# Patient Record
Sex: Male | Born: 1995 | Race: Black or African American | Hispanic: No | Marital: Single | Smoking: Never smoker
Health system: Southern US, Community
[De-identification: ages and names within clinical notes are randomized; demographics above are authoritative.]

## PROBLEM LIST (undated history)

## (undated) DIAGNOSIS — R519 Headache, unspecified: Secondary | ICD-10-CM

## (undated) DIAGNOSIS — R51 Headache: Secondary | ICD-10-CM

## (undated) HISTORY — PX: OTHER SURGICAL HISTORY: SHX169

---

## 2016-03-21 ENCOUNTER — Encounter (HOSPITAL_COMMUNITY): Payer: Self-pay | Admitting: Emergency Medicine

## 2016-03-21 ENCOUNTER — Emergency Department (HOSPITAL_COMMUNITY)
Admission: EM | Admit: 2016-03-21 | Discharge: 2016-03-21 | Disposition: A | Discharge status: Home / Self Care | Payer: Worker's Compensation | Attending: Dermatology | Admitting: Dermatology

## 2016-03-21 DIAGNOSIS — W228XXA Striking against or struck by other objects, initial encounter: Secondary | ICD-10-CM | POA: Insufficient documentation

## 2016-03-21 DIAGNOSIS — Z5321 Procedure and treatment not carried out due to patient leaving prior to being seen by health care provider: Secondary | ICD-10-CM | POA: Diagnosis not present

## 2016-03-21 DIAGNOSIS — S0990XA Unspecified injury of head, initial encounter: Secondary | ICD-10-CM | POA: Insufficient documentation

## 2016-03-21 DIAGNOSIS — Y939 Activity, unspecified: Secondary | ICD-10-CM | POA: Diagnosis not present

## 2016-03-21 DIAGNOSIS — Y990 Civilian activity done for income or pay: Secondary | ICD-10-CM | POA: Diagnosis not present

## 2016-03-21 DIAGNOSIS — Y929 Unspecified place or not applicable: Secondary | ICD-10-CM | POA: Diagnosis not present

## 2016-03-21 NOTE — ED Notes (Signed)
Transport team called pt multiple times unable to locate in [place]. This RN walked out to waiting area unable to find pt.

## 2016-03-21 NOTE — ED Notes (Signed)
Pt reports the was a work and a pallet that was 1 ft above him fell and hit him on the left side of his head. Pt denies any LOC. Pt states it did not knock him over. Pt states afterwards he has blurry vision on the right side that is improving but still "a little blurry" Pt was sent here from health works for possible CT scan per pt. Pt is alert and ox4. PERRLA. Denies any dizziness.

## 2016-03-21 NOTE — ED Notes (Signed)
Pt still unable to be located in waiting room. Pt did not inform any medical staff that he was leaving.

## 2016-03-22 ENCOUNTER — Encounter (HOSPITAL_COMMUNITY): Payer: Self-pay | Admitting: Emergency Medicine

## 2016-03-22 ENCOUNTER — Emergency Department (HOSPITAL_COMMUNITY)
Admission: EM | Admit: 2016-03-22 | Discharge: 2016-03-22 | Disposition: A | Discharge status: Home / Self Care | Payer: Worker's Compensation | Attending: Emergency Medicine | Admitting: Emergency Medicine

## 2016-03-22 DIAGNOSIS — Y9289 Other specified places as the place of occurrence of the external cause: Secondary | ICD-10-CM | POA: Diagnosis not present

## 2016-03-22 DIAGNOSIS — S0990XA Unspecified injury of head, initial encounter: Secondary | ICD-10-CM | POA: Diagnosis present

## 2016-03-22 DIAGNOSIS — Y990 Civilian activity done for income or pay: Secondary | ICD-10-CM | POA: Insufficient documentation

## 2016-03-22 DIAGNOSIS — Y9389 Activity, other specified: Secondary | ICD-10-CM | POA: Insufficient documentation

## 2016-03-22 DIAGNOSIS — W208XXA Other cause of strike by thrown, projected or falling object, initial encounter: Secondary | ICD-10-CM | POA: Diagnosis not present

## 2016-03-22 DIAGNOSIS — S0093XA Contusion of unspecified part of head, initial encounter: Secondary | ICD-10-CM | POA: Diagnosis not present

## 2016-03-22 NOTE — ED Provider Notes (Signed)
CSN: [id number]     Arrival date & time 03/22/16  1703 History  By signing my name below, I, Dalton Hobbs, attest that this documentation has been prepared under the direction and in the presence of Kerrie Buffalo, NP Electronically Signed: Soijett Hobbs, ED Scribe. 03/22/2016. 7:56 PM.   Chief Complaint  Patient presents with  . Head Injury      The history is provided by the patient. No language interpreter was used.    Dalton Hobbs is a 20 y.o. male who presents to the Emergency Department complaining of left sided head injury occurring 9:30 AM yesterday. Pt states that a plastic bin that was approxiamtely 30 lbs fell 1.5 feet and struck the left side of his head and face. Pt reports that he was sent home from his shift and taken to a health center. Pt notes that he was informed to come into the ED for a CT head due to the pt blurred vision. Pt reports that he came into the ED last night but "had to wait to long" then his brother called and needed him to do something so he left. He returns tonightt due to intermittent pain to his left sided head and face. Patient reports resolved blurred vision, resolved dizziness, and resolved HA. He notes that he has not tried any medications for the relief of his symptoms. He denies color change, wound, rash, LOC, neck pain, ear pain, bowel/bladder incontinence, and any other symptoms. He needs a work note to be able to return to work.    History reviewed. No pertinent past medical history. History reviewed. No pertinent past surgical history. History reviewed. No pertinent family history. Social History  Substance Use Topics  . Smoking status: Never Smoker   . Smokeless tobacco: None  . Alcohol Use: No    Review of Systems  HENT: Negative for ear pain.   Eyes: Positive for visual disturbance (resolved blurred vision).  Gastrointestinal:       No bowel incontinence.  Genitourinary:       No bladder incontinence.  Musculoskeletal: Negative for neck  pain.  Skin: Negative for color change, rash and wound.  Neurological: Positive for dizziness (resolved) and headaches (resolved). Negative for syncope.   All other systems negative   Allergies  Review of patient's allergies indicates no known allergies.  Home Medications   Prior to Admission medications   Not on File   BP 119/69 mmHg  Pulse 55  Temp(Src) 97.9 F (36.6 C) (Oral)  Resp 16  SpO2 100% Physical Exam  Constitutional: He is oriented to person, place, and time. He appears well-developed and well-nourished. No distress.  HENT:  Head: Normocephalic. Head is without abrasion and without laceration.  Right Ear: Tympanic membrane normal.  Left Ear: Tympanic membrane normal.  Nose: Nose normal.  Mouth/Throat: Uvula is midline, oropharynx is clear and moist and mucous membranes are normal. Normal dentition.  No ecchymosis or visible injury to the face or scalp.  Eyes: Conjunctivae and EOM are normal. Pupils are equal, round, and reactive to light. Right eye exhibits no discharge. Left eye exhibits no discharge. No scleral icterus.  Nl visual fields.   Neck: Normal range of motion. Neck supple.  Full ROM of neck without pain  Cardiovascular: Normal rate and regular rhythm.   Radial pulse 2+  Pulmonary/Chest: Effort normal and breath sounds normal. No respiratory distress.  Abdominal: He exhibits no distension.  Musculoskeletal: Normal range of motion. He exhibits no edema.  Grip strength equal  bilaterally.   Neurological: He is alert and oriented to person, place, and time. He has normal strength. No cranial nerve deficit or sensory deficit. He displays a negative Romberg sign. Gait normal.  Reflex Scores:      Bicep reflexes are 2+ on the right side and 2+ on the left side.      Brachioradialis reflexes are 2+ on the right side and 2+ on the left side.      Patellar reflexes are 2+ on the right side and 2+ on the left side. Rapid alternating movement without  difficulty. Stands on one foot without difficulty.  Skin: Skin is warm and dry.  Psychiatric: He has a normal mood and affect. His behavior is normal.  Nursing note and vitals reviewed.   ED Course  Procedures (including critical care time) DIAGNOSTIC STUDIES: Oxygen Saturation is 99% on RA, nl by my interpretation.    COORDINATION OF CARE: 7:55 PM Discussed treatment plan with pt at bedside and pt agreed to plan.    MDM  20 y.o. male here for evaluation of injury that occurred over 24 hours ago stable for d/c without any focal neuro deficits, no headache, no n/v, no neck pain. CT of head not indicated at this time. Discussed with the patient plan of care and all questioned fully answered. He will return if any problems arise.   Final diagnoses:  Contusion of head, initial encounter    I personally performed the services described in this documentation, which was scribed in my presence. The recorded information has been reviewed and is accurate.    [address]Teron Blais ChesilhurstM Tila Millirons, [place] 03/23/16 1607  Linwood DibblesJon Knapp, MD 03/24/16 [phone]

## 2016-03-22 NOTE — ED Notes (Signed)
Pt sts hit on left side of head and face by plastic bin at work yesterday; pt denies LOC; pt sts having pain

## 2016-03-22 NOTE — Discharge Instructions (Signed)

## 2016-03-22 NOTE — ED Notes (Signed)
Discharge instructions given - voiced understanding,  Return to work note given to patient

## 2016-03-22 NOTE — ED Notes (Signed)
Patient presents stating he was hit in the head yesterday by a box that fell about 1 1/602feet and hit him on the left side of the temple area.  Came here yesterday for a CT but stated he had an emergency and had to leave.  Denies any nausea, vision changes, etc today.  Stated he tried to go back to work today but they told him he could not work until he received treatment

## 2016-06-09 ENCOUNTER — Observation Stay (HOSPITAL_COMMUNITY)
Admission: EM | Admit: 2016-06-09 | Discharge: 2016-06-11 | Disposition: A | Discharge status: Home / Self Care | Payer: BLUE CROSS/BLUE SHIELD | Attending: Surgery | Admitting: Surgery

## 2016-06-09 ENCOUNTER — Observation Stay (HOSPITAL_COMMUNITY): Payer: BLUE CROSS/BLUE SHIELD | Admitting: Anesthesiology

## 2016-06-09 ENCOUNTER — Encounter (HOSPITAL_COMMUNITY): Payer: Self-pay | Admitting: Emergency Medicine

## 2016-06-09 ENCOUNTER — Emergency Department (HOSPITAL_COMMUNITY): Payer: BLUE CROSS/BLUE SHIELD

## 2016-06-09 ENCOUNTER — Encounter (HOSPITAL_COMMUNITY)
Admission: EM | Disposition: A | Discharge status: Home / Self Care | Payer: Self-pay | Source: Home / Self Care | Attending: Emergency Medicine

## 2016-06-09 DIAGNOSIS — K3580 Unspecified acute appendicitis: Secondary | ICD-10-CM | POA: Diagnosis present

## 2016-06-09 DIAGNOSIS — K353 Acute appendicitis with localized peritonitis, without perforation or gangrene: Secondary | ICD-10-CM

## 2016-06-09 DIAGNOSIS — K37 Unspecified appendicitis: Secondary | ICD-10-CM | POA: Diagnosis present

## 2016-06-09 DIAGNOSIS — Z6833 Body mass index (BMI) 33.0-33.9, adult: Secondary | ICD-10-CM | POA: Diagnosis not present

## 2016-06-09 DIAGNOSIS — E669 Obesity, unspecified: Secondary | ICD-10-CM | POA: Diagnosis not present

## 2016-06-09 HISTORY — DX: Headache: R51

## 2016-06-09 HISTORY — PX: APPENDECTOMY: SHX54

## 2016-06-09 HISTORY — PX: LAPAROSCOPIC APPENDECTOMY: SHX408

## 2016-06-09 HISTORY — DX: Headache, unspecified: R51.9

## 2016-06-09 LAB — COMPREHENSIVE METABOLIC PANEL
ALBUMIN: 3.7 g/dL (ref 3.5–5.0)
ALT: 14 U/L — ABNORMAL LOW (ref 17–63)
ANION GAP: 11 (ref 5–15)
AST: 13 U/L — ABNORMAL LOW (ref 15–41)
Alkaline Phosphatase: 44 U/L (ref 38–126)
BUN: 15 mg/dL (ref 6–20)
CALCIUM: 9.1 mg/dL (ref 8.9–10.3)
CHLORIDE: 103 mmol/L (ref 101–111)
CO2: 24 mmol/L (ref 22–32)
Creatinine, Ser: 1.02 mg/dL (ref 0.61–1.24)
GFR calc non Af Amer: >60 mL/min (ref >60)
GLUCOSE: 106 mg/dL — AB (ref 65–99)
POTASSIUM: 3.5 mmol/L (ref 3.5–5.1)
SODIUM: 138 mmol/L (ref 135–145)
Total Bilirubin: 0.8 mg/dL (ref 0.3–1.2)
Total Protein: 6.4 g/dL — ABNORMAL LOW (ref 6.5–8.1)

## 2016-06-09 LAB — URINALYSIS, ROUTINE W REFLEX MICROSCOPIC
BILIRUBIN URINE: NEGATIVE
Glucose, UA: NEGATIVE mg/dL
Ketones, ur: NEGATIVE mg/dL
Leukocytes, UA: NEGATIVE
Nitrite: NEGATIVE
Protein, ur: NEGATIVE mg/dL
SPECIFIC GRAVITY, URINE: 1.037 — AB (ref 1.005–1.030)
pH: 6 (ref 5.0–8.0)

## 2016-06-09 LAB — URINE MICROSCOPIC-ADD ON: Bacteria, UA: NONE SEEN

## 2016-06-09 LAB — CBC
HCT: 42.1 % (ref 39.0–52.0)
HEMATOCRIT: 43.6 % (ref 39.0–52.0)
HEMOGLOBIN: 14.9 g/dL (ref 13.0–17.0)
Hemoglobin: 13.9 g/dL (ref 13.0–17.0)
MCH: 28.8 pg (ref 26.0–34.0)
MCH: 28.2 pg (ref 26.0–34.0)
MCHC: 34.2 g/dL (ref 30.0–36.0)
MCHC: 33 g/dL (ref 30.0–36.0)
MCV: 84.3 fL (ref 78.0–100.0)
MCV: 85.4 fL (ref 78.0–100.0)
PLATELETS: 199 10*3/uL (ref 150–400)
Platelets: 225 10*3/uL (ref 150–400)
RBC: 5.17 MIL/uL (ref 4.22–5.81)
RBC: 4.93 MIL/uL (ref 4.22–5.81)
RDW: 12.9 % (ref 11.5–15.5)
RDW: 13 % (ref 11.5–15.5)
WBC: 10.7 10*3/uL — ABNORMAL HIGH (ref 4.0–10.5)
WBC: 13.3 10*3/uL — AB (ref 4.0–10.5)

## 2016-06-09 LAB — CREATININE, SERUM
CREATININE: 0.85 mg/dL (ref 0.61–1.24)
GFR calc non Af Amer: >60 mL/min (ref >60)

## 2016-06-09 LAB — LIPASE, BLOOD: LIPASE: 24 U/L (ref 11–51)

## 2016-06-09 SURGERY — APPENDECTOMY, LAPAROSCOPIC
Anesthesia: General | Site: Abdomen

## 2016-06-09 MED ORDER — OXYCODONE HCL 5 MG PO TABS: 5.0000 mg | ORAL_TABLET | ORAL | Status: DC | PRN

## 2016-06-09 MED ORDER — SUGAMMADEX SODIUM 200 MG/2ML IV SOLN
INTRAVENOUS | Status: DC | PRN
  Administered 2016-06-09: 200 mg via INTRAVENOUS

## 2016-06-09 MED ORDER — FENTANYL CITRATE (PF) 100 MCG/2ML IJ SOLN
INTRAMUSCULAR | Status: AC
  Filled 2016-06-09: qty 4

## 2016-06-09 MED ORDER — 0.9 % SODIUM CHLORIDE (POUR BTL) OPTIME
TOPICAL | Status: DC | PRN
  Administered 2016-06-09: 1000 mL

## 2016-06-09 MED ORDER — ACETAMINOPHEN 325 MG PO TABS
650.0000 mg | ORAL_TABLET | Freq: Four times a day (QID) | ORAL | Status: DC | PRN
  Administered 2016-06-10: 650 mg via ORAL
  Filled 2016-06-09: qty 2

## 2016-06-09 MED ORDER — PROPOFOL 10 MG/ML IV BOLUS
INTRAVENOUS | Status: AC
  Filled 2016-06-09: qty 40

## 2016-06-09 MED ORDER — LACTATED RINGERS IV SOLN
INTRAVENOUS | Status: DC
  Administered 2016-06-09: 11:00:00 via INTRAVENOUS

## 2016-06-09 MED ORDER — ENOXAPARIN SODIUM 40 MG/0.4ML ~~LOC~~ SOLN
40.0000 mg | SUBCUTANEOUS | Status: DC
  Administered 2016-06-10 – 2016-06-11 (×2): 40 mg via SUBCUTANEOUS
  Filled 2016-06-09 (×2): qty 0.4

## 2016-06-09 MED ORDER — OXYCODONE HCL 5 MG PO TABS
5.0000 mg | ORAL_TABLET | ORAL | Status: DC | PRN
  Administered 2016-06-09 – 2016-06-10 (×4): 10 mg via ORAL
  Filled 2016-06-09 (×4): qty 2

## 2016-06-09 MED ORDER — KETOROLAC TROMETHAMINE 30 MG/ML IJ SOLN
INTRAMUSCULAR | Status: DC | PRN
  Administered 2016-06-09: 30 mg via INTRAVENOUS

## 2016-06-09 MED ORDER — ONDANSETRON HCL 4 MG/2ML IJ SOLN: 4.0000 mg | Freq: Four times a day (QID) | INTRAMUSCULAR | Status: DC | PRN

## 2016-06-09 MED ORDER — ENOXAPARIN SODIUM 40 MG/0.4ML ~~LOC~~ SOLN: 40.0000 mg | SUBCUTANEOUS | Status: DC

## 2016-06-09 MED ORDER — ROCURONIUM BROMIDE 10 MG/ML (PF) SYRINGE
PREFILLED_SYRINGE | INTRAVENOUS | Status: AC
  Filled 2016-06-09: qty 10

## 2016-06-09 MED ORDER — LIDOCAINE 2% (20 MG/ML) 5 ML SYRINGE
INTRAMUSCULAR | Status: AC
  Filled 2016-06-09: qty 5

## 2016-06-09 MED ORDER — ONDANSETRON HCL 4 MG/2ML IJ SOLN
INTRAMUSCULAR | Status: AC
  Filled 2016-06-09: qty 2

## 2016-06-09 MED ORDER — SUCCINYLCHOLINE CHLORIDE 200 MG/10ML IV SOSY
PREFILLED_SYRINGE | INTRAVENOUS | Status: DC | PRN
  Administered 2016-06-09: 100 mg via INTRAVENOUS

## 2016-06-09 MED ORDER — HYDROMORPHONE HCL 1 MG/ML IJ SOLN
1.0000 mg | Freq: Once | INTRAMUSCULAR | Status: AC
  Administered 2016-06-09: 1 mg via INTRAVENOUS
  Filled 2016-06-09: qty 1

## 2016-06-09 MED ORDER — HYDROMORPHONE HCL 1 MG/ML IJ SOLN: 1.0000 mg | INTRAMUSCULAR | Status: DC | PRN

## 2016-06-09 MED ORDER — ROCURONIUM BROMIDE 10 MG/ML (PF) SYRINGE
PREFILLED_SYRINGE | INTRAVENOUS | Status: DC | PRN
  Administered 2016-06-09: 30 mg via INTRAVENOUS

## 2016-06-09 MED ORDER — SODIUM CHLORIDE 0.9 % IR SOLN
Status: DC | PRN
  Administered 2016-06-09: 1000 mL

## 2016-06-09 MED ORDER — FENTANYL CITRATE (PF) 100 MCG/2ML IJ SOLN
INTRAMUSCULAR | Status: DC | PRN
  Administered 2016-06-09 (×3): 50 ug via INTRAVENOUS
  Administered 2016-06-09: 100 ug via INTRAVENOUS

## 2016-06-09 MED ORDER — SODIUM CHLORIDE 0.45 % IV SOLN
INTRAVENOUS | Status: DC
  Administered 2016-06-09: 09:00:00 via INTRAVENOUS

## 2016-06-09 MED ORDER — ACETAMINOPHEN 325 MG PO TABS: 650.0000 mg | ORAL_TABLET | Freq: Four times a day (QID) | ORAL | Status: DC | PRN

## 2016-06-09 MED ORDER — ACETAMINOPHEN 650 MG RE SUPP: 650.0000 mg | Freq: Four times a day (QID) | RECTAL | Status: DC | PRN

## 2016-06-09 MED ORDER — SODIUM CHLORIDE 0.9 % IV BOLUS (SEPSIS)
500.0000 mL | Freq: Once | INTRAVENOUS | Status: AC
  Administered 2016-06-09: 500 mL via INTRAVENOUS

## 2016-06-09 MED ORDER — SUGAMMADEX SODIUM 200 MG/2ML IV SOLN
INTRAVENOUS | Status: AC
  Filled 2016-06-09: qty 2

## 2016-06-09 MED ORDER — KETOROLAC TROMETHAMINE 30 MG/ML IJ SOLN
INTRAMUSCULAR | Status: AC
  Filled 2016-06-09: qty 1

## 2016-06-09 MED ORDER — BUPIVACAINE HCL (PF) 0.25 % IJ SOLN
INTRAMUSCULAR | Status: DC | PRN
  Administered 2016-06-09: 20 mL

## 2016-06-09 MED ORDER — LIDOCAINE HCL (CARDIAC) 20 MG/ML IV SOLN
INTRAVENOUS | Status: DC | PRN
  Administered 2016-06-09: 60 mg via INTRAVENOUS

## 2016-06-09 MED ORDER — FENTANYL CITRATE (PF) 100 MCG/2ML IJ SOLN
INTRAMUSCULAR | Status: AC
  Filled 2016-06-09: qty 2

## 2016-06-09 MED ORDER — DIPHENHYDRAMINE HCL 25 MG PO CAPS: 25.0000 mg | ORAL_CAPSULE | Freq: Four times a day (QID) | ORAL | Status: DC | PRN

## 2016-06-09 MED ORDER — DIPHENHYDRAMINE HCL 50 MG/ML IJ SOLN: 25.0000 mg | Freq: Four times a day (QID) | INTRAMUSCULAR | Status: DC | PRN

## 2016-06-09 MED ORDER — BUPIVACAINE HCL (PF) 0.25 % IJ SOLN
INTRAMUSCULAR | Status: AC
  Filled 2016-06-09: qty 30

## 2016-06-09 MED ORDER — DEXTROSE 5 % IV SOLN
2.0000 g | INTRAVENOUS | Status: DC
  Administered 2016-06-09: 2 g via INTRAVENOUS
  Filled 2016-06-09: qty 2

## 2016-06-09 MED ORDER — METRONIDAZOLE IN NACL 5-0.79 MG/ML-% IV SOLN
500.0000 mg | Freq: Three times a day (TID) | INTRAVENOUS | Status: DC
  Administered 2016-06-09: 500 mg via INTRAVENOUS
  Filled 2016-06-09: qty 100

## 2016-06-09 MED ORDER — MORPHINE SULFATE (PF) 2 MG/ML IV SOLN
1.0000 mg | INTRAVENOUS | Status: DC | PRN
  Administered 2016-06-09: 2 mg via INTRAVENOUS
  Filled 2016-06-09: qty 1

## 2016-06-09 MED ORDER — POTASSIUM CHLORIDE IN NACL 20-0.9 MEQ/L-% IV SOLN
INTRAVENOUS | Status: DC
  Administered 2016-06-09: 15:00:00 via INTRAVENOUS
  Filled 2016-06-09 (×4): qty 1000

## 2016-06-09 MED ORDER — ONDANSETRON HCL 4 MG/2ML IJ SOLN
4.0000 mg | Freq: Once | INTRAMUSCULAR | Status: AC
  Administered 2016-06-09: 4 mg via INTRAVENOUS
  Filled 2016-06-09: qty 2

## 2016-06-09 MED ORDER — IOPAMIDOL (ISOVUE-300) INJECTION 61%
100.0000 mL | Freq: Once | INTRAVENOUS | Status: AC | PRN
  Administered 2016-06-09: 100 mL via INTRAVENOUS

## 2016-06-09 MED ORDER — ONDANSETRON HCL 4 MG/2ML IJ SOLN
INTRAMUSCULAR | Status: DC | PRN
  Administered 2016-06-09: 4 mg via INTRAVENOUS

## 2016-06-09 MED ORDER — POLYETHYLENE GLYCOL 3350 17 G PO PACK: 17.0000 g | PACK | Freq: Every day | ORAL | Status: DC | PRN

## 2016-06-09 MED ORDER — PROPOFOL 10 MG/ML IV BOLUS
INTRAVENOUS | Status: DC | PRN
  Administered 2016-06-09: 50 mg via INTRAVENOUS
  Administered 2016-06-09: 40 mg via INTRAVENOUS
  Administered 2016-06-09: 160 mg via INTRAVENOUS

## 2016-06-09 MED ORDER — ONDANSETRON 4 MG PO TBDP
4.0000 mg | ORAL_TABLET | Freq: Four times a day (QID) | ORAL | Status: DC | PRN
  Administered 2016-06-10: 4 mg via ORAL
  Filled 2016-06-09: qty 1

## 2016-06-09 MED ORDER — BISACODYL 5 MG PO TBEC: 5.0000 mg | DELAYED_RELEASE_TABLET | Freq: Every day | ORAL | Status: DC | PRN

## 2016-06-09 MED ORDER — IOPAMIDOL (ISOVUE-300) INJECTION 61%
INTRAVENOUS | Status: AC
  Filled 2016-06-09: qty 100

## 2016-06-09 MED ORDER — ROCURONIUM BROMIDE 100 MG/10ML IV SOLN: INTRAVENOUS | Status: DC | PRN

## 2016-06-09 MED ORDER — SUCCINYLCHOLINE CHLORIDE 20 MG/ML IJ SOLN: INTRAMUSCULAR | Status: DC | PRN

## 2016-06-09 MED ORDER — MIDAZOLAM HCL 2 MG/2ML IJ SOLN
INTRAMUSCULAR | Status: AC
  Filled 2016-06-09: qty 2

## 2016-06-09 MED ORDER — ACETAMINOPHEN 650 MG RE SUPP
650.0000 mg | Freq: Four times a day (QID) | RECTAL | Status: DC | PRN
Start: 2016-06-09 — End: 2016-06-09

## 2016-06-09 MED ORDER — ONDANSETRON 4 MG PO TBDP: 4.0000 mg | ORAL_TABLET | Freq: Four times a day (QID) | ORAL | Status: DC | PRN

## 2016-06-09 MED ORDER — MIDAZOLAM HCL 5 MG/5ML IJ SOLN
INTRAMUSCULAR | Status: DC | PRN
  Administered 2016-06-09: 2 mg via INTRAVENOUS

## 2016-06-09 MED ORDER — BUPIVACAINE-EPINEPHRINE 0.25% -1:200000 IJ SOLN
INTRAMUSCULAR | Status: AC
  Filled 2016-06-09: qty 1

## 2016-06-09 SURGICAL SUPPLY — 37 items
APPLIER CLIP 5 13 M/L LIGAMAX5 (MISCELLANEOUS)
APPLIER CLIP ROT 10 11.4 M/L (STAPLE)
CANISTER SUCTION 2500CC (MISCELLANEOUS) ×3 IMPLANT
CHLORAPREP W/TINT 26ML (MISCELLANEOUS) ×3 IMPLANT
CLIP APPLIE 5 13 M/L LIGAMAX5 (MISCELLANEOUS) IMPLANT
CLIP APPLIE ROT 10 11.4 M/L (STAPLE) IMPLANT
COVER SURGICAL LIGHT HANDLE (MISCELLANEOUS) ×3 IMPLANT
CUTTER FLEX LINEAR 45M (STAPLE) ×3 IMPLANT
DERMABOND ADVANCED (GAUZE/BANDAGES/DRESSINGS) ×2
DERMABOND ADVANCED .7 DNX12 (GAUZE/BANDAGES/DRESSINGS) ×1 IMPLANT
ELECT REM PT RETURN 9FT ADLT (ELECTROSURGICAL) ×3
ELECTRODE REM PT RTRN 9FT ADLT (ELECTROSURGICAL) ×1 IMPLANT
GLOVE SURG SIGNA 7.5 PF LTX (GLOVE) ×3 IMPLANT
GOWN STRL REUS W/ TWL LRG LVL3 (GOWN DISPOSABLE) ×2 IMPLANT
GOWN STRL REUS W/ TWL XL LVL3 (GOWN DISPOSABLE) ×1 IMPLANT
GOWN STRL REUS W/TWL LRG LVL3 (GOWN DISPOSABLE) ×4
GOWN STRL REUS W/TWL XL LVL3 (GOWN DISPOSABLE) ×2
KIT BASIN OR (CUSTOM PROCEDURE TRAY) ×3 IMPLANT
KIT ROOM TURNOVER OR (KITS) ×3 IMPLANT
LIQUID BAND (GAUZE/BANDAGES/DRESSINGS) IMPLANT
NS IRRIG 1000ML POUR BTL (IV SOLUTION) ×3 IMPLANT
PAD ARMBOARD 7.5X6 YLW CONV (MISCELLANEOUS) ×6 IMPLANT
POUCH SPECIMEN RETRIEVAL 10MM (ENDOMECHANICALS) ×3 IMPLANT
RELOAD 45 VASCULAR/THIN (ENDOMECHANICALS) IMPLANT
RELOAD STAPLE TA45 3.5 REG BLU (ENDOMECHANICALS) ×6 IMPLANT
SCALPEL HARMONIC ACE (MISCELLANEOUS) ×3 IMPLANT
SCISSORS LAP 5X35 DISP (ENDOMECHANICALS) ×3 IMPLANT
SET IRRIG TUBING LAPAROSCOPIC (IRRIGATION / IRRIGATOR) ×3 IMPLANT
SLEEVE ENDOPATH XCEL 5M (ENDOMECHANICALS) ×3 IMPLANT
SPECIMEN JAR SMALL (MISCELLANEOUS) ×3 IMPLANT
SUT MON AB 4-0 PC3 18 (SUTURE) ×3 IMPLANT
TOWEL OR 17X24 6PK STRL BLUE (TOWEL DISPOSABLE) ×3 IMPLANT
TOWEL OR 17X26 10 PK STRL BLUE (TOWEL DISPOSABLE) IMPLANT
TRAY LAPAROSCOPIC MC (CUSTOM PROCEDURE TRAY) ×3 IMPLANT
TROCAR XCEL BLUNT TIP 100MML (ENDOMECHANICALS) ×3 IMPLANT
TROCAR XCEL NON-BLD 5MMX100MML (ENDOMECHANICALS) ×3 IMPLANT
TUBING INSUFFLATION (TUBING) ×3 IMPLANT

## 2016-06-09 NOTE — ED Triage Notes (Signed)
Patient with history of abdominal pain for the last 24 hours.  Patient denies any nausea or vomiting.  Patient states that if he sits for any length of time, the pain increases. He has been looking at the internet and thinks he has IBS.

## 2016-06-09 NOTE — Anesthesia Procedure Notes (Signed)
Procedure Name: Intubation Date/Time: 06/09/2016 11:38 AM Performed by: Jed LimerickHARDER, Lilyanah Celestin S Pre-anesthesia Checklist: Patient identified, Emergency Drugs available, Suction available and Patient being monitored Patient Re-evaluated:Patient Re-evaluated prior to inductionOxygen Delivery Method: Circle System Utilized Preoxygenation: Pre-oxygenation with 100% oxygen Intubation Type: IV induction Ventilation: Mask ventilation without difficulty Laryngoscope Size: Mac and 3 Grade View: Grade I Tube type: Oral Tube size: 7.5 mm Number of attempts: 1 Airway Equipment and Method: Stylet Placement Confirmation: ETT inserted through vocal cords under direct vision,  positive ETCO2 and breath sounds checked- equal and bilateral Secured at: 22 cm Tube secured with: Tape Dental Injury: Teeth and Oropharynx as per pre-operative assessment

## 2016-06-09 NOTE — ED Notes (Signed)
Patient transported to CT 

## 2016-06-09 NOTE — Discharge Instructions (Signed)

## 2016-06-09 NOTE — ED Notes (Signed)
Report given to Short stay.  

## 2016-06-09 NOTE — ED Provider Notes (Signed)
[facility]-EMERGENCY DEPT Provider Note   CSN: [id number] Arrival date & time: 06/09/16  [id number]     History   Chief Complaint Chief Complaint  Patient presents with  . Abdominal Pain    HPI Dalton Hobbs is a 20 y.o. male.  He presents for evaluation of 2 days of diffuse abdominal pain associated with decreased appetite, but no vomiting. He denies fever, chills, cough, shortness of breath, chest pain, weakness or dizziness. Her similar problem. He describes the chest pain as severe. There are no other known modifying factors.   HPI  History reviewed. No pertinent past medical history.  There are no active problems to display for this patient.   History reviewed. No pertinent surgical history.     Home Medications    Prior to Admission medications   Not on File    Family History No family history on file.  Social History Social History  Substance Use Topics  . Smoking status: Never Smoker  . Smokeless tobacco: Never Used  . Alcohol use No     Allergies   Review of patient's allergies indicates no known allergies.   Review of Systems Review of Systems  All other systems reviewed and are negative.    Physical Exam Updated Vital Signs BP 145/78 (BP Location: Right Arm)   Pulse 72   Temp 98.3 F (36.8 C) (Oral)   Resp 17   Ht 5\' 9"  (1.753 m)   Wt 228 lb (103.4 kg)   SpO2 97%   BMI 33.67 kg/m   Physical Exam  Constitutional: He is oriented to person, place, and time. He appears well-developed and well-nourished.  HENT:  Head: Normocephalic and atraumatic.  Right Ear: External ear normal.  Left Ear: External ear normal.  Eyes: Conjunctivae and EOM are normal. Pupils are equal, round, and reactive to light.  Neck: Normal range of motion and phonation normal. Neck supple.  Cardiovascular: Normal rate, regular rhythm and normal heart sounds.   Pulmonary/Chest: Effort normal and breath sounds normal. He exhibits no bony tenderness.  Abdominal: Soft.  He exhibits no mass. There is tenderness (Diffuse, moderate). There is no rebound and no guarding.  Musculoskeletal: Normal range of motion.  Neurological: He is alert and oriented to person, place, and time. No cranial nerve deficit or sensory deficit. He exhibits normal muscle tone. Coordination normal.  Skin: Skin is warm, dry and intact.  Psychiatric: He has a normal mood and affect. His behavior is normal. Judgment and thought content normal.  Nursing note and vitals reviewed.    ED Treatments / Results  Labs (all labs ordered are listed, but only abnormal results are displayed) Labs Reviewed  COMPREHENSIVE METABOLIC PANEL - Abnormal; Notable for the following:       Result Value   Glucose, Bld 106 (*)    Total Protein 6.4 (*)    AST 13 (*)    ALT 14 (*)    All other components within normal limits  CBC - Abnormal; Notable for the following:    WBC 10.7 (*)    All other components within normal limits  URINALYSIS, ROUTINE W REFLEX MICROSCOPIC (NOT [facility]) - Abnormal; Notable for the following:    Color, Urine AMBER (*)    Specific Gravity, Urine 1.037 (*)    Hgb urine dipstick SMALL (*)    All other components within normal limits  URINE MICROSCOPIC-ADD ON - Abnormal; Notable for the following:    Squamous Epithelial / LPF 0-5 (*)  All other components within normal limits  LIPASE, BLOOD    EKG  EKG Interpretation None       Radiology No results found.  Procedures Procedures (including critical care time)  Medications Ordered in ED Medications  sodium chloride 0.9 % bolus 500 mL (not administered)  HYDROmorphone (DILAUDID) injection 1 mg (not administered)  ondansetron (ZOFRAN) injection 4 mg (not administered)     Initial Impression / Assessment and Plan / ED Course  I have reviewed the triage vital signs and the nursing notes.  Pertinent labs & imaging results that were available during my care of the patient were reviewed by me and considered in my  medical decision making (see chart for details).  Clinical Course    Medications  iopamidol (ISOVUE-300) 61 % injection (not administered)  sodium chloride 0.9 % bolus 500 mL (500 mLs Intravenous New Bag/Given 06/09/16 0618)  HYDROmorphone (DILAUDID) injection 1 mg (1 mg Intravenous Given 06/09/16 0618)  ondansetron (ZOFRAN) injection 4 mg (4 mg Intravenous Given 06/09/16 0618)  iopamidol (ISOVUE-300) 61 % injection 100 mL (100 mLs Intravenous Contrast Given 06/09/16 0643)    Patient Vitals for the past 24 hrs:  BP Temp Temp src Pulse Resp SpO2 Height Weight  06/09/16 0630 130/68 - - 63 - 96 % - -  06/09/16 0600 129/68 - - 67 - 98 % - -  06/09/16 0530 133/94 - - 68 - 97 % - -  06/09/16 0328 145/78 98.3 F (36.8 C) Oral 72 17 97 % 5\' 9"  (1.753 m) 228 lb (103.4 kg)   CT imaging ordered to evaluate for acute intra-abdominal processes.  7:34 AM Reevaluation with update and discussion. After initial assessment and treatment, an updated evaluation reveals he is more comfortable now. Misaki Sozio L   07:45- Paged to General Surgery to see for operative repair. Will see in ED (07:58).  Final Clinical Impressions(s) / ED Diagnoses   Final diagnoses:  Acute appendicitis with localized peritonitis    Acute appendicitis, with likely low-grade perforation. He'll likely require surgical intervention, for treatment.  Nursing Notes Reviewed/ Care Coordinated, and agree without changes. Applicable Imaging Reviewed.  Interpretation of Laboratory Data incorporated into ED treatment  Plan- surgical evaluation in the emergency department  New Prescriptions New Prescriptions   No medications on file     Mancel Bale, MD 06/09/16 [phone]

## 2016-06-09 NOTE — H&P (Signed)
[facility] Admission Note  Dalton Hobbs 11-Apr-1996  [id number].    Requesting MD: Christ Kick Chief Complaint/Reason for Consult: Abdominal pain  HPI:  Dalton Hobbs is a 20yo male who [facility] the [facility] earlier this morning complaining of 2 days of diffuse abdominal pain. The pain has gradually gotten worse. He also reports nausea, vomiting, and decreased appetite. Denies fever, chills, or dysuria. Last meal yesterday. Last BM yesterday.  ED workup: - CT: Advanced acute appendicitis with regional inflammatory changes and probable early perforation. No well defined abscess however. No free fluid or air. 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  History reviewed. No pertinent past medical history.  History reviewed. No pertinent surgical history.  Social History:  reports that he has never smoked. He has never used smokeless tobacco. He reports that he does not drink alcohol or use drugs.  Allergies: No Known Allergies   (Not in a hospital admission)  Blood pressure 128/80, pulse 67, temperature 98.3 F (36.8 C), temperature source Oral, resp. rate 15, height 5' 9"  (1.753 m), weight 228 lb (103.4 kg), SpO2 96 %. Physical Exam: General: pleasant, WD/WN AA male who is laying in bed in NAD HEENT: head is normocephalic, atraumatic.  Mouth is pink and moist Heart: regular, rate, and rhythm.  Palpable pedal pulses bilaterally Lungs: CTAB. Mild bilateral rhonchi.  Respiratory effort nonlabored Abd: soft, ND, +BS, no masses, hernias, or organomegaly. Tender right-sided abdomen RLQ>RUQ MS: all 4  extremities are symmetrical with no cyanosis, clubbing, or edema. Skin: warm and dry with no masses, lesions, or rashes Psych: A&Ox3 with an appropriate affect.   Results for orders placed or performed during the hospital encounter of 06/09/16 (from the past 48 hour(s))  Lipase, blood     Status: None   Collection Time: 06/09/16  3:33 AM  Result Value Ref Range   Lipase 24 11 - 51 U/L  Comprehensive metabolic panel     Status: Abnormal   Collection Time: 06/09/16  3:33 AM  Result Value Ref Range   Sodium 138 135 - 145 mmol/L   Potassium 3.5 3.5 - 5.1 mmol/L   Chloride 103 101 - 111 mmol/L   CO2 24 22 - 32 mmol/L   Glucose, Bld 106 (H) 65 - 99 mg/dL   BUN 15 6 - 20 mg/dL   Creatinine, Ser 1.02 0.61 - 1.24 mg/dL   Calcium 9.1 8.9 - 10.3 mg/dL   Total Protein 6.4 (L) 6.5 - 8.1 g/dL   Albumin 3.7 3.5 - 5.0 g/dL   AST 13 (L) 15 - 41 U/L   ALT 14 (L) 17 - 63 U/L   Alkaline Phosphatase 44 38 - 126 U/L   Total Bilirubin 0.8 0.3 - 1.2 mg/dL   GFR calc non Af Amer >60 >60 mL/min   GFR calc Af Amer >60 >60 mL/min    Comment: (NOTE) The eGFR has been calculated using the CKD EPI equation. This calculation has not been validated in all clinical situations. eGFR's persistently <60 mL/min signify possible Chronic Kidney Disease.    Anion gap 11 5 - 15  CBC  Status: Abnormal   Collection Time: 06/09/16  3:33 AM  Result Value Ref Range   WBC 10.7 (H) 4.0 - 10.5 K/uL   RBC 5.17 4.22 - 5.81 MIL/uL   Hemoglobin 14.9 13.0 - 17.0 g/dL   HCT 43.6 39.0 - 52.0 %   MCV 84.3 78.0 - 100.0 fL   MCH 28.8 26.0 - 34.0 pg   MCHC 34.2 30.0 - 36.0 g/dL   RDW 12.9 11.5 - 15.5 %   Platelets 225 150 - 400 K/uL  Urinalysis, Routine w reflex microscopic     Status: Abnormal   Collection Time: 06/09/16  3:33 AM  Result Value Ref Range   Color, Urine AMBER (A) YELLOW    Comment: BIOCHEMICALS MAY BE AFFECTED BY COLOR   APPearance CLEAR CLEAR   Specific Gravity, Urine 1.037 (H) 1.005 - 1.030   pH 6.0  5.0 - 8.0   Glucose, UA NEGATIVE NEGATIVE mg/dL   Hgb urine dipstick SMALL (A) NEGATIVE   Bilirubin Urine NEGATIVE NEGATIVE   Ketones, ur NEGATIVE NEGATIVE mg/dL   Protein, ur NEGATIVE NEGATIVE mg/dL   Nitrite NEGATIVE NEGATIVE   Leukocytes, UA NEGATIVE NEGATIVE  Urine microscopic-add on     Status: Abnormal   Collection Time: 06/09/16  3:33 AM  Result Value Ref Range   Squamous Epithelial / LPF 0-5 (A) NONE SEEN   WBC, UA 0-5 0 - 5 WBC/hpf   RBC / HPF 0-5 0 - 5 RBC/hpf   Bacteria, UA NONE SEEN NONE SEEN   Urine-Other MUCOUS PRESENT    Ct Abdomen Pelvis W Contrast  Result Date: 06/09/2016 CLINICAL DATA:  Right lower quadrant pain beginning 2 days ago, worsening EXAM: CT ABDOMEN AND PELVIS WITH CONTRAST TECHNIQUE: Multidetector CT imaging of the abdomen and pelvis was performed using the standard protocol following bolus administration of intravenous contrast. CONTRAST:  187m ISOVUE-300 IOPAMIDOL (ISOVUE-300) INJECTION 61% COMPARISON:  None. FINDINGS: Lower chest: Normal Hepatobiliary: Normal Pancreas: Normal Spleen: Normal Adrenals/Urinary Tract: Normal Stomach/Bowel: There is advanced acute appendicitis with regional inflammation, possible orally perforation but without definable abscess, free fluid or free air. Remainder the intestine is normal. Vascular/Lymphatic: Normal Reproductive: Normal Other: None Musculoskeletal: Normal IMPRESSION: Advanced acute appendicitis with regional inflammatory changes and probable early perforation. No well defined abscess however. No free fluid or air. Electronically Signed   By: MNelson ChimesM.D.   On: 06/09/2016 07:32      Assessment/Plan Acute appendicitis - CT: Advanced acute appendicitis with regional inflammatory changes and probable early perforation. No well defined abscess however. No free fluid or air. - WBC 10.7, afebrile  ID - rocephin/flagyl VTE - SCDs FEN - NPO, IVF  Plan - admit to med-surg. NPO, IVF, pain control, antibiotics  (rocephin/flagyl). OR later today for laparoscopic appendectomy.  [facility]Jerrye Beavers [facility] 06/09/2016, 8:04 AM Pager: [phone]: [phone]-Fri 7:00 am-4:30 pm Sat-Sun 7:00 am-11:30 am

## 2016-06-09 NOTE — Anesthesia Postprocedure Evaluation (Signed)
Anesthesia Post Note  Patient: Dalton Hobbs  Procedure(s) Performed: Procedure(s) (LRB): APPENDECTOMY LAPAROSCOPIC (N/A)  Patient location during evaluation: PACU Anesthesia Type: General Level of consciousness: awake and alert Pain management: pain level controlled Vital Signs Assessment: post-procedure vital signs reviewed and stable Respiratory status: spontaneous breathing, nonlabored ventilation and respiratory function stable Cardiovascular status: blood pressure returned to baseline and stable Postop Assessment: no signs of nausea or vomiting Anesthetic complications: no    Last Vitals:  Vitals:   06/09/16 1345 06/09/16 1355  BP: 125/79 135/76  Pulse: 77 77  Resp: 18 19  Temp:  36.4 C    Last Pain:  Vitals:   06/09/16 1355  TempSrc:   PainSc: 0-No pain                 Journe Hallmark,W. EDMOND

## 2016-06-09 NOTE — ED Notes (Signed)
Attempted report 

## 2016-06-09 NOTE — Transfer of Care (Signed)
Immediate Anesthesia Transfer of Care Note  Patient: Dalton FerrierJayquan Georgiades  Procedure(s) Performed: Procedure(s): APPENDECTOMY LAPAROSCOPIC (N/A)  Patient Location: PACU  Anesthesia Type:General  Level of Consciousness: awake, alert  and patient cooperative  Airway & Oxygen Therapy: Patient Spontanous Breathing and Patient connected to nasal cannula oxygen  Post-op Assessment: Report given to RN and Post -op Vital signs reviewed and stable  Post vital signs: Reviewed and stable  Last Vitals:  Vitals:   06/09/16 0930 06/09/16 1000  BP: 117/71 (!) 137/54  Pulse: 67 67  Resp:    Temp:      Last Pain:  Vitals:   06/09/16 0938  TempSrc:   PainSc: 2          Complications: No apparent anesthesia complications

## 2016-06-09 NOTE — Anesthesia Preprocedure Evaluation (Signed)
Anesthesia Evaluation  Patient identified by MRN, date of birth, ID band Patient awake    Reviewed: Allergy & Precautions, NPO status , Patient's Chart, lab work & pertinent test results  Airway Mallampati: II  TM Distance: >3 FB Neck ROM: Full    Dental no notable dental hx.    Pulmonary neg pulmonary ROS,    Pulmonary exam normal breath sounds clear to auscultation       Cardiovascular negative cardio ROS Normal cardiovascular exam Rhythm:Regular Rate:Normal     Neuro/Psych negative neurological ROS  negative psych ROS   GI/Hepatic negative GI ROS, Neg liver ROS,   Endo/Other  negative endocrine ROS  Renal/GU negative Renal ROS  negative genitourinary   Musculoskeletal negative musculoskeletal ROS (+)   Abdominal (+) + obese,   Peds negative pediatric ROS (+)  Hematology negative hematology ROS (+)   Anesthesia Other Findings   Reproductive/Obstetrics negative OB ROS                             Anesthesia Physical Anesthesia Plan  ASA: II  Anesthesia Plan: General   Post-op Pain Management:    Induction: Intravenous  Airway Management Planned: Oral ETT  Additional Equipment:   Intra-op Plan:   Post-operative Plan: Extubation in OR  Informed Consent: I have reviewed the patients History and Physical, chart, labs and discussed the procedure including the risks, benefits and alternatives for the proposed anesthesia with the patient or authorized representative who has indicated his/her understanding and acceptance.   Dental advisory given  Plan Discussed with: CRNA  Anesthesia Plan Comments:         Anesthesia Quick Evaluation  

## 2016-06-09 NOTE — Op Note (Signed)
Appendectomy, Lap, Procedure Note  Indications: The patient presented with a history of right-sided abdominal pain. A CT revealed findings consistent with acute appendicitis.  Pre-operative Diagnosis: scute appendicitis  Post-operative Diagnosis: Same  Surgeon: Abigail MiyamotoBLACKMAN,Charne Mcbrien A   Assistants: 0  Anesthesia: General endotracheal anesthesia  ASA Class: 1  Procedure Details  The patient was seen again in the Holding Room. The risks, benefits, complications, treatment options, and expected outcomes were discussed with the patient and/or family. The possibilities of reaction to medication, perforation of viscus, bleeding, recurrent infection, finding a normal appendix, the need for additional procedures, failure to diagnose a condition, and creating a complication requiring transfusion or operation were discussed. There was concurrence with the proposed plan and informed consent was obtained. The site of surgery was properly noted. The patient was taken to Operating Room, identified as Dalton Hobbs and the procedure verified as Appendectomy. A Time Out was held and the above information confirmed.  The patient was placed in the supine position and general anesthesia was induced, along with placement of orogastric tube, Venodyne boots, and a Foley catheter. The abdomen was prepped and draped in a sterile fashion. A one centimeter infraumbilical incision was made.  The  midline fascia was incised with a #15 blade.  A Kelly clamp was used to confirm entrance into the peritoneal cavity.  A pursestring suture was passed around the incision with a 0 Vicryl.  The Hasson was introduced into the abdomen and the tails of the suture were used to hold the Hasson in place.   The pneumoperitoneum was then established to steady pressure of 15 mmHg.  Additional 5 mm cannulas then placed in the left lower quadrant of the abdomen and the suprapubic region under direct visualization. A careful evaluation of the  entire abdomen was carried out. The patient was placed in Trendelenburg and left lateral decubitus position. The small intestines were retracted in the cephalad and left lateral direction away from the pelvis and right lower quadrant. The patient was found to have an enlarged and inflamed appendix that was extending into the pelvis and stuck to the abdominal side wall. There was no evidence of perforation but it was very distended and had some necrosis  The appendix was carefully dissected. The appendix was was skeletonized with the harmonic scalpel.   The appendix was divided at its base using an endo-GIA stapler twice. Minimal appendiceal stump was left in place. There was no evidence of bleeding, leakage, or complication after division of the appendix. Irrigation was also performed and irrigate suctioned from the abdomen as well.  The umbilical port site was closed with the purse string suture. There was no residual palpable fascial defect.  The trocar site skin wounds were closed with 4-0 Monocryl.  Instrument, sponge, and needle counts were correct at the conclusion of the case.   Findings: The appendix was found to be inflamed. There were signs of necrosis.  There was not perforation. There was not abscess formation.  Estimated Blood Loss:  Minimal         Drains:none         Complications:  None; patient tolerated the procedure well.         Disposition: PACU - hemodynamically stable.         Condition: stable

## 2016-06-09 NOTE — ED Notes (Signed)
Report attempted 

## 2016-06-10 ENCOUNTER — Encounter (HOSPITAL_COMMUNITY): Payer: Self-pay | Admitting: Surgery

## 2016-06-10 NOTE — Progress Notes (Signed)
1 Day Post-Op  Subjective: Still with moderate abdominal pain post op  Objective: Vital signs in last 24 hours: Temp:  [97.5 F (36.4 C)-99.3 F (37.4 C)] 99.3 F (37.4 C) (10/06 0429) Pulse Rate:  [67-95] 95 (10/06 0429) Resp:  [16-20] 18 (10/06 0429) BP: (109-137)/(54-85) 119/56 (10/06 0429) SpO2:  [92 %-99 %] 97 % (10/06 0429) Last BM Date: 06/09/16 (before surgery)  Intake/Output from previous day: 10/05 0701 - 10/06 0700 In: 3893.3 [P.O.:480; I.V.:2363.3; IV Piggyback:1050] Out: 1400 [Urine:1375; Blood:25] Intake/Output this shift: No intake/output data recorded.  Exam: Awake and alert Comfortable Abdomen soft, moderately tender  Lab Results:   Recent Labs  06/09/16 0333 06/09/16 0918  WBC 10.7* 13.3*  HGB 14.9 13.9  HCT 43.6 42.1  PLT 225 199   BMET  Recent Labs  06/09/16 0333 06/09/16 0918  NA 138  --   K 3.5  --   CL 103  --   CO2 24  --   GLUCOSE 106*  --   BUN 15  --   CREATININE 1.02 0.85  CALCIUM 9.1  --    PT/INR No results for input(s): LABPROT, INR in the last 72 hours. ABG No results for input(s): PHART, HCO3 in the last 72 hours.  Invalid input(s): PCO2, PO2  Studies/Results: Ct Abdomen Pelvis W Contrast  Result Date: 06/09/2016 CLINICAL DATA:  Right lower quadrant pain beginning 2 days ago, worsening EXAM: CT ABDOMEN AND PELVIS WITH CONTRAST TECHNIQUE: Multidetector CT imaging of the abdomen and pelvis was performed using the standard protocol following bolus administration of intravenous contrast. CONTRAST:  100mL ISOVUE-300 IOPAMIDOL (ISOVUE-300) INJECTION 61% COMPARISON:  None. FINDINGS: Lower chest: Normal Hepatobiliary: Normal Pancreas: Normal Spleen: Normal Adrenals/Urinary Tract: Normal Stomach/Bowel: There is advanced acute appendicitis with regional inflammation, possible orally perforation but without definable abscess, free fluid or free air. Remainder the intestine is normal. Vascular/Lymphatic: Normal Reproductive: Normal  Other: None Musculoskeletal: Normal IMPRESSION: Advanced acute appendicitis with regional inflammatory changes and probable early perforation. No well defined abscess however. No free fluid or air. Electronically Signed   By: Paulina FusiMark  Shogry M.D.   On: 06/09/2016 07:32    Anti-infectives: Anti-infectives    Start     Dose/Rate Route Frequency Ordered Stop   06/09/16 0845  cefTRIAXone (ROCEPHIN) 2 g in dextrose 5 % 50 mL IVPB  Status:  Discontinued     2 g 100 mL/hr over 30 Minutes Intravenous Every 24 hours 06/09/16 0834 06/09/16 1407   06/09/16 0845  metroNIDAZOLE (FLAGYL) IVPB 500 mg  Status:  Discontinued     500 mg 100 mL/hr over 60 Minutes Intravenous Every 8 hours 06/09/16 0834 06/09/16 1407      Assessment/Plan: s/p Procedure(s): APPENDECTOMY LAPAROSCOPIC (N/A)  Ambulate Advance diet Potential home later today vs tomorrow  LOS: 0 days    Lyann Hagstrom A 06/10/2016

## 2016-06-11 MED ORDER — HYDROCODONE-ACETAMINOPHEN 5-325 MG PO TABS: 1.0000 | ORAL_TABLET | Freq: Four times a day (QID) | ORAL | 0 refills | Status: DC | PRN

## 2016-06-11 NOTE — Progress Notes (Addendum)
Discharge instructions gone over with patient. Home medications gone over. Follow up appointment to be made. Prescription given. Diet, activity, and bowel regimen discussed. Signs and symptoms of infection and reasons to call the doctor discussed.   Incisional care discussed. Patient verbalized understanding of instructions.

## 2016-06-11 NOTE — Discharge Summary (Signed)
  Patient ID: Dalton FerrierJayquan Hobbs [id number] 20 y.o. 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  No diarrhea.  CT scan showed evidence of acute appendicitis inflammatory changes.  He was evaluated and resuscitated in the emergency department.  Started on antibiotics and admitted for appendectomy  Hospital Course: The patient was taken to the operating room by Dr. Abigail Miyamotoouglas Blackman on 06/09/2016.  Laparoscopic appendectomy was performed.  The appendix was found to be enlarged and inflamed and extending into the pelvis but there was no evidence of perforation.  There is a little bit of patchy necrosis. The patient did well.  He was not ready to go home on postop day 1 due to anorexia and pain but was ready to be discharged on postop day 2.  That point time he was tolerating a diet.  Pain was well controlled.  He was ambulating independently.  Voiding without difficulty.  Examination on the day of discharge revealed the abdomen was soft.  Wounds look fine.  Minimal tenderness. Diet and activities were discussed.  Discharge instructions were given.  Prescription for Norco was given.  He was asked to call and make an appointment to see us in the office in about 3 weeks.  Consults: None  Significant Diagnostic Studies: Lab work.  CT scan.  Surgical pathology which is pending.  Treatments: surgery: Laparoscopic appendectomy  Disposition: Home  Patient  Instructions:    Medication List    TAKE these medications   HYDROcodone-acetaminophen 5-325 MG tablet Commonly known as:  NORCO Take 1-2 tablets by mouth every 6 (six) hours as needed for moderate pain or severe pain.       Activity: activity as tolerated Diet: low fat, low cholesterol diet Wound Care: none needed  Follow-up:  With Dr. Abigail Miyamotoouglas Blackman in 3 weeks.  Signed: Angelia MouldHaywood M. Derrell LollingIngram, M.D., FACS General and minimally invasive surgery Breast and Colorectal Surgery  06/11/2016, 9:27 AM

## 2017-12-13 IMAGING — CT CT ABD-PELV W/ CM
2 of 4 series · 11 of 46 positions shown, 12 images · IV contrast (iopamidol)
Comparison: None.

CLINICAL DATA: Right lower quadrant pain beginning 2 days ago,
worsening

EXAM:
CT ABDOMEN AND PELVIS WITH CONTRAST
TECHNIQUE: Multidetector CT imaging of the abdomen and pelvis was performed
using the standard protocol following bolus administration of
intravenous contrast.
CONTRAST:  100mL 7ATKH3-7QQ IOPAMIDOL (7ATKH3-7QQ) INJECTION 61%

[Series 201: routine, idose (2) · axial · 0.78mm/px · z∈[-476,-51]mm · 8 of 103 slices shown, 9 images]
[im 9/103  soft-tissue]
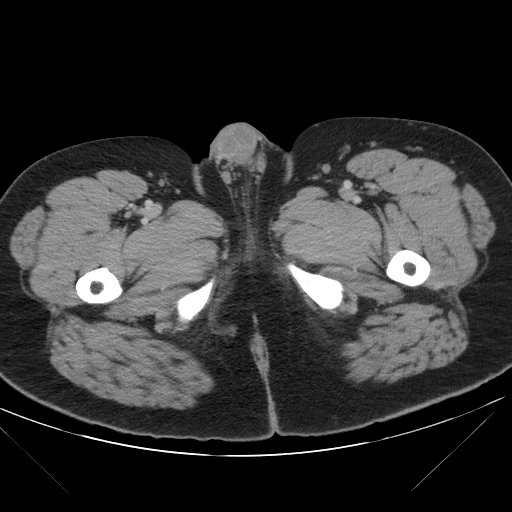
[im 9/103  bone]
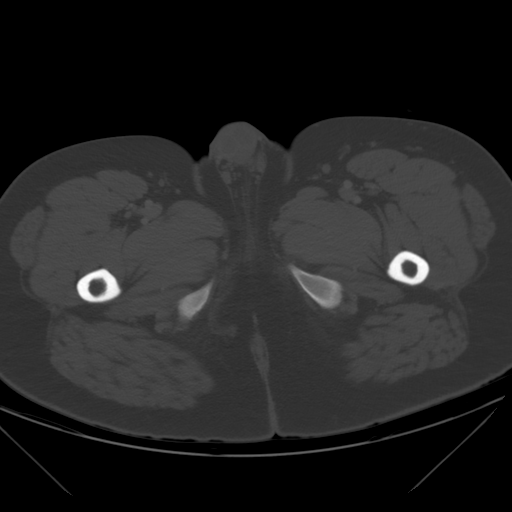
[im 23/103  soft-tissue]
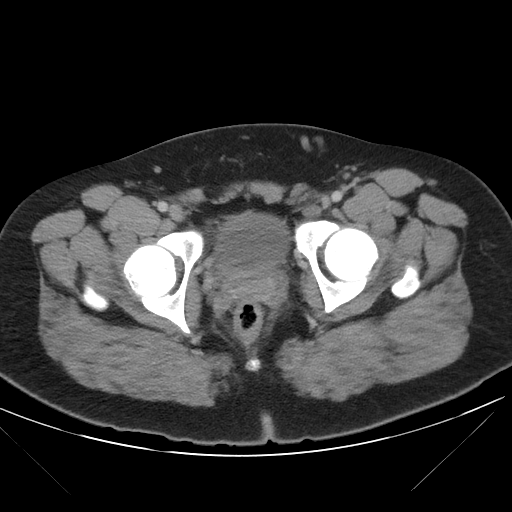
[im 32/103  soft-tissue]
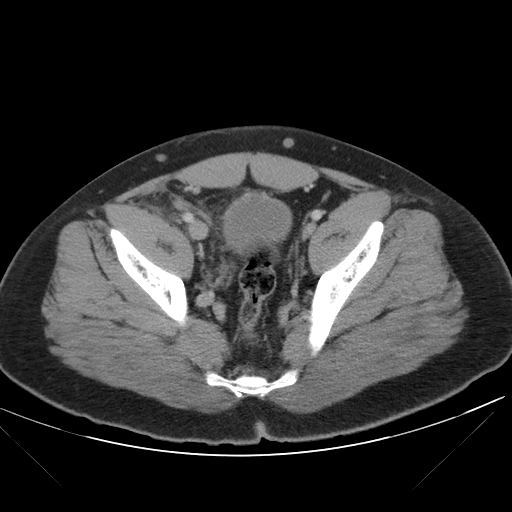
[im 45/103  soft-tissue]
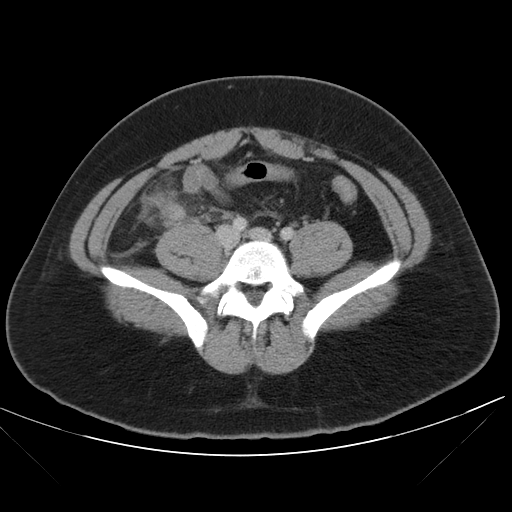
[im 58/103  soft-tissue]
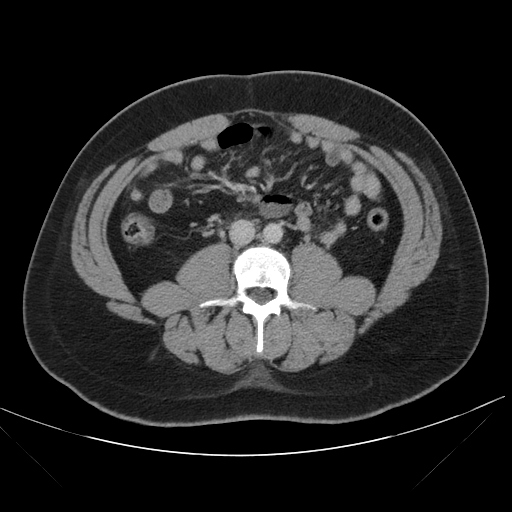
[im 71/103  soft-tissue]
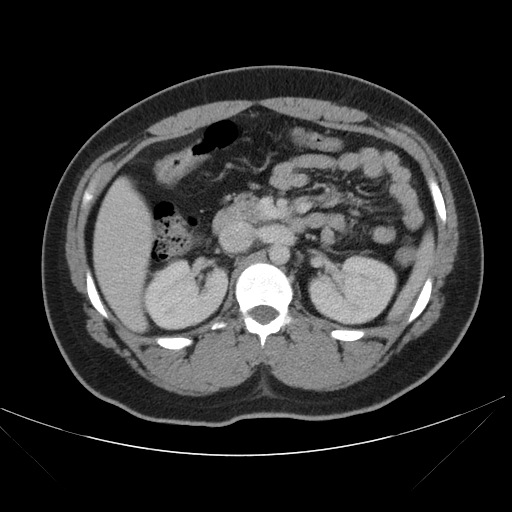
[im 80/103  soft-tissue]
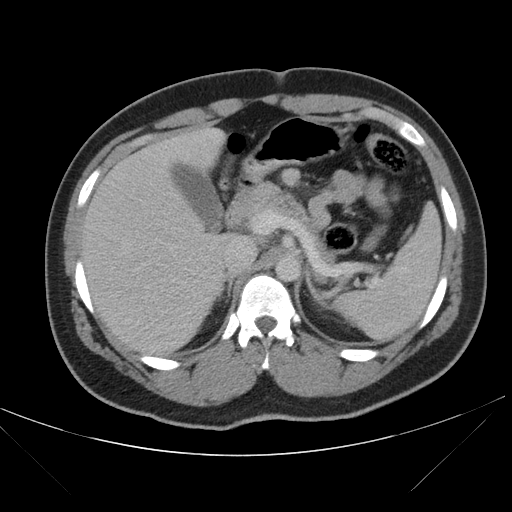
[im 94/103  soft-tissue]
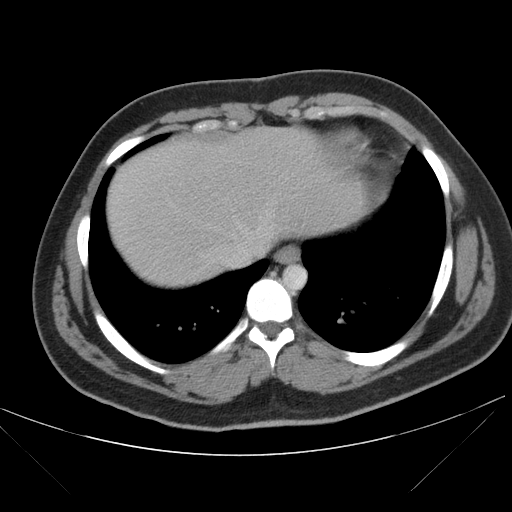

[Series 203: coronals, idose (2) · coronal · 0.45mm/px · 3 of 114 slices shown]
[im 38/114  soft-tissue]
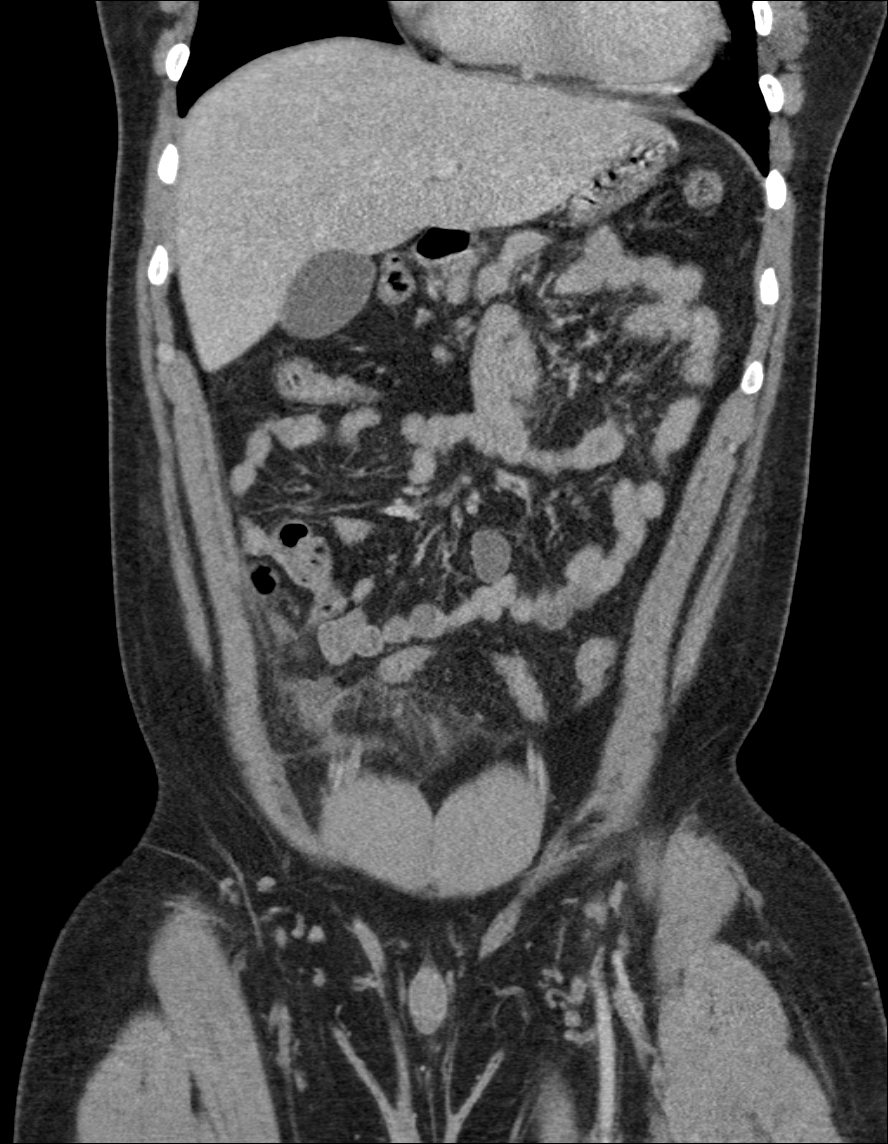
[im 51/114  soft-tissue]
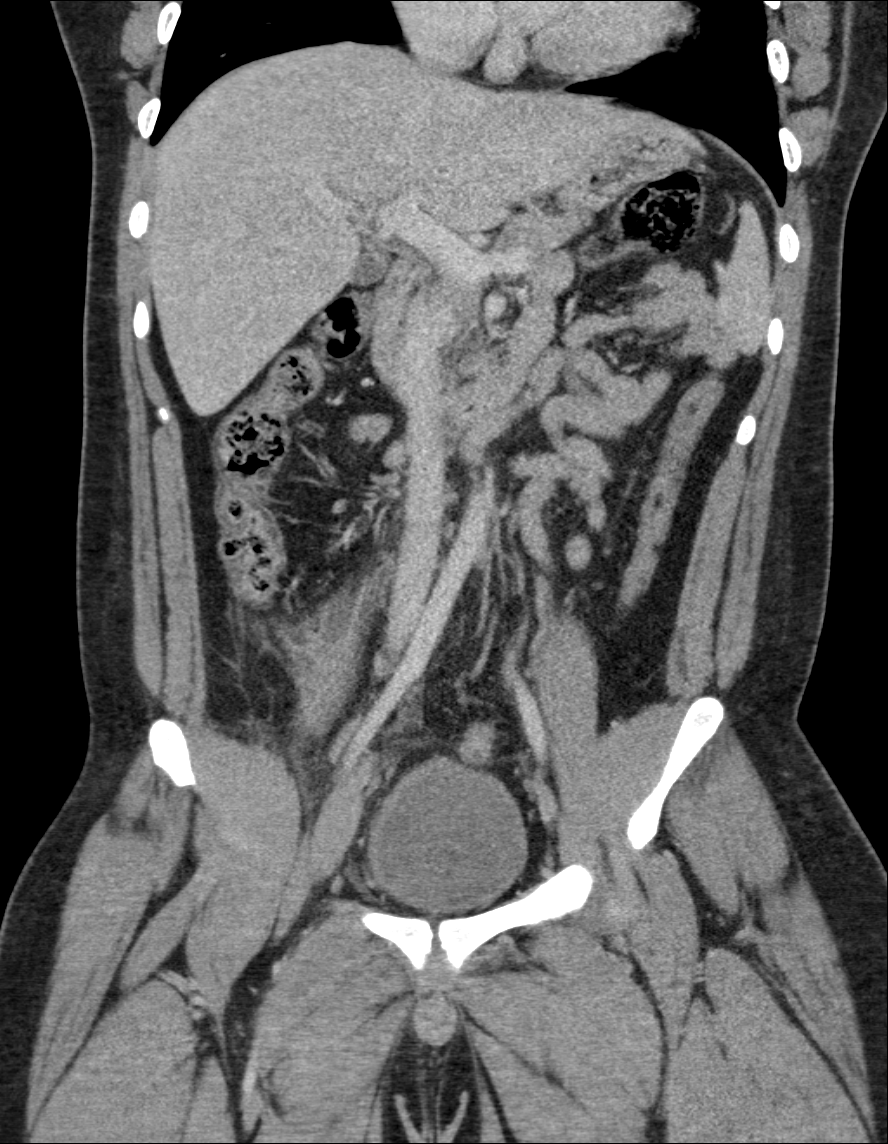
[im 63/114  soft-tissue]
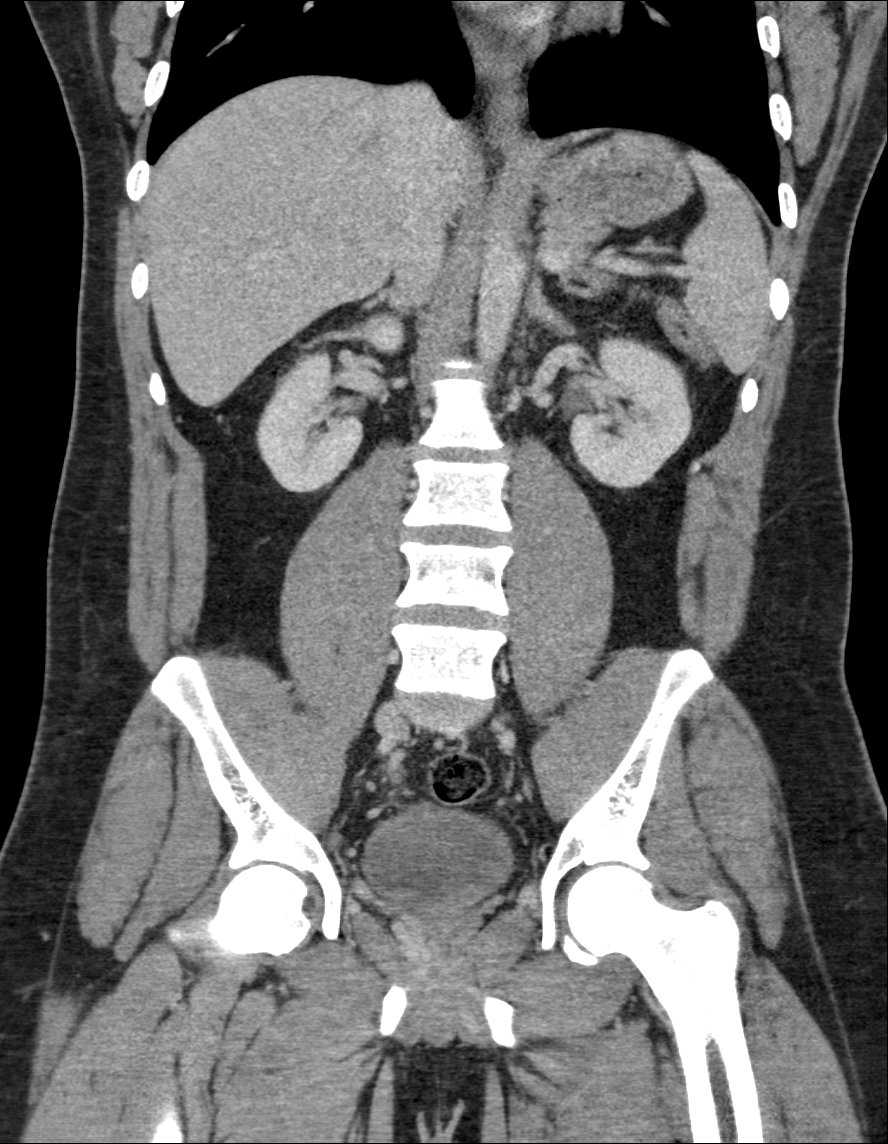

[11 of 46 positions shown; findings below may reference images not displayed]

FINDINGS: Lower chest: Normal

Hepatobiliary: Normal

Pancreas: Normal

Spleen: Normal

Adrenals/Urinary Tract: Normal

Stomach/Bowel: There is advanced acute appendicitis with regional
inflammation, possible orally perforation but without definable
abscess, free fluid or free air. Remainder the intestine is normal.

Vascular/Lymphatic: Normal

Reproductive: Normal

Other: None

Musculoskeletal: Normal
IMPRESSION: Advanced acute appendicitis with regional inflammatory changes and
probable early perforation. No well defined abscess however. No free
fluid or air.

## 2022-01-13 ENCOUNTER — Encounter: Payer: Self-pay | Admitting: Family Medicine

## 2022-01-13 ENCOUNTER — Other Ambulatory Visit: Payer: 59

## 2022-01-13 ENCOUNTER — Ambulatory Visit (INDEPENDENT_AMBULATORY_CARE_PROVIDER_SITE_OTHER): Payer: 59 | Admitting: Family Medicine

## 2022-01-13 VITALS — BP 130/86 | HR 72 | Temp 97.8°F | Ht 69.0 in | Wt 273.0 lb

## 2022-01-13 DIAGNOSIS — R682 Dry mouth, unspecified: Secondary | ICD-10-CM

## 2022-01-13 DIAGNOSIS — Z833 Family history of diabetes mellitus: Secondary | ICD-10-CM

## 2022-01-13 DIAGNOSIS — R350 Frequency of micturition: Secondary | ICD-10-CM | POA: Diagnosis not present

## 2022-01-13 LAB — URINALYSIS, ROUTINE W REFLEX MICROSCOPIC
Ketones, ur: 40 — AB
Leukocytes,Ua: NEGATIVE
Nitrite: NEGATIVE
Specific Gravity, Urine: 1.025 (ref 1.000–1.030)
Urine Glucose: NEGATIVE
Urobilinogen, UA: 0.2 (ref 0.0–1.0)
pH: 6 (ref 5.0–8.0)

## 2022-01-13 LAB — CBC WITH DIFFERENTIAL/PLATELET
Basophils Absolute: 0 10*3/uL (ref 0.0–0.1)
Basophils Relative: 0.3 % (ref 0.0–3.0)
Eosinophils Absolute: 0.2 10*3/uL (ref 0.0–0.7)
Eosinophils Relative: 4.7 % (ref 0.0–5.0)
HCT: 46.6 % (ref 39.0–52.0)
Hemoglobin: 15.5 g/dL (ref 13.0–17.0)
Lymphocytes Relative: 36.1 % (ref 12.0–46.0)
Lymphs Abs: 1.6 10*3/uL (ref 0.7–4.0)
MCHC: 33.2 g/dL (ref 30.0–36.0)
MCV: 82.9 fl (ref 78.0–100.0)
Monocytes Absolute: 0.4 10*3/uL (ref 0.1–1.0)
Monocytes Relative: 9.8 % (ref 3.0–12.0)
Neutro Abs: 2.2 10*3/uL (ref 1.4–7.7)
Neutrophils Relative %: 49.1 % (ref 43.0–77.0)
Platelets: 247 10*3/uL (ref 150.0–400.0)
RBC: 5.63 Mil/uL (ref 4.22–5.81)
RDW: 14.8 % (ref 11.5–15.5)
WBC: 4.5 10*3/uL (ref 4.0–10.5)

## 2022-01-13 LAB — COMPREHENSIVE METABOLIC PANEL
ALT: 40 U/L (ref 0–53)
AST: 27 U/L (ref 0–37)
Albumin: 5 g/dL (ref 3.5–5.2)
Alkaline Phosphatase: 63 U/L (ref 39–117)
BUN: 13 mg/dL (ref 6–23)
CO2: 24 mEq/L (ref 19–32)
Calcium: 10.3 mg/dL (ref 8.4–10.5)
Chloride: 104 mEq/L (ref 96–112)
Creatinine, Ser: 0.81 mg/dL (ref 0.40–1.50)
GFR: 120.55 mL/min (ref >60.00)
Glucose, Bld: 105 mg/dL — ABNORMAL HIGH (ref 70–99)
Potassium: 3.7 mEq/L (ref 3.5–5.1)
Sodium: 140 mEq/L (ref 135–145)
Total Bilirubin: 0.6 mg/dL (ref 0.2–1.2)
Total Protein: 8.1 g/dL (ref 6.0–8.3)

## 2022-01-13 LAB — LIPID PANEL
Cholesterol: 214 mg/dL — ABNORMAL HIGH (ref 0–200)
HDL: 42.4 mg/dL (ref >39.00)
LDL Cholesterol: 157 mg/dL — ABNORMAL HIGH (ref 0–99)
NonHDL: 171.96
Total CHOL/HDL Ratio: 5
Triglycerides: 75 mg/dL (ref 0.0–149.0)
VLDL: 15 mg/dL (ref 0.0–40.0)

## 2022-01-13 LAB — T4, FREE: Free T4: 0.75 ng/dL (ref 0.60–1.60)

## 2022-01-13 LAB — TSH: TSH: 3.14 u[IU]/mL (ref 0.35–5.50)

## 2022-01-13 NOTE — Patient Instructions (Signed)
It was a pleasure meeting you today.  Thank you for trusting [place] with your health care. ? ?Keep up the good work with eating a healthy diet and exercise. ? ?We will be in touch with your results from today. ?

## 2022-01-13 NOTE — Progress Notes (Signed)
? ?New Patient Office Visit ? ?Subjective   ? ?Patient ID: Dalton Hobbs, male    DOB: 1996/03/27  Age: 26 y.o. MRN: [id number] ? ?CC:  ?Chief Complaint  ?Patient presents with  ? Establish Care  ?  Beginning of march has been having trouble sleeping due to frequent urination accompanied by infrequent during the day slight muscle weakness and dry mouth. ?Took 2 weeks since diet and exercise change to get back to normal  ? Headache  ?  Wake up with headaches   ? ? ?HPI ?Dalton Hobbs presents to establish care ?Reports dry mouth and frequent urination in March but made diet and exercise changes and those symptoms resolved. States he cut out 90% of the sugar he was eating.  ?Family hx of diabetes in father and MGM.  ? ?States he switched to a keto diet.  ? ?Reports weight loss ? ?Headaches in the mornings  ? ?Outpatient Encounter Medications as of 01/13/2022  ?Medication Sig  ? [DISCONTINUED] HYDROcodone-acetaminophen (NORCO) 5-325 MG tablet Take 1-2 tablets by mouth every 6 (six) hours as needed for moderate pain or severe pain.  ? ?No facility-administered encounter medications on file as of 01/13/2022.  ? ? ?Past Medical History:  ?Diagnosis Date  ? Headache   ? ? ?Past Surgical History:  ?Procedure Laterality Date  ? APPENDECTOMY  06/09/2016  ? 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 LAPAROSCOPIC APPENDECTOMY N/A 06/09/2016  ? Procedure: APPENDECTOMY LAPAROSCOPIC;  Surgeon: Abigail Miyamoto, MD;  Location: [facility] OR;  Service: General;  Laterality: N/A;  ? ? ?Family History  ?Problem Relation Age of Onset  ? Diabetes Father   ? ? ?Social History  ? ?Socioeconomic History  ? Marital status: Single  ?  Spouse name: Not on file  ? Number of children: Not on file  ? Years of education: Not on file  ? Highest education level: Not on file  ?Occupational History  ? Not on file  ?Tobacco Use  ? Smoking status: Never  ? Smokeless tobacco: Never  ?Substance and Sexual Activity  ? Alcohol use: No  ? Drug use: No  ? Sexual  activity: Not on file  ?Other Topics Concern  ? Not on file  ?Social History Narrative  ? Not on file  ? ?Social Determinants of Health  ? ?Financial Resource Strain: Not on file  ?Food Insecurity: Not on file  ?Transportation Needs: Not on file  ?Physical Activity: Not on file  ?Stress: Not on file  ?Social Connections: Not on file  ?Intimate Partner Violence: Not on file  ? ? ?ROS ?Pertinent positives and negatives in the history of present illness. ? ?  ? ? ?Objective   ? ?BP 130/86 (BP Location: Left Arm, Patient Position: Sitting, Cuff Size: Large)   Pulse 72   Temp 97.8 ?F (36.6 ?C) (Temporal)   Ht 5\' 9"  (1.753 m)   Wt 273 lb (123.8 kg)   SpO2 98%   BMI 40.32 kg/m?  ? ?Physical Exam ?Constitutional:   ?   General: He is not in acute distress. ?   Appearance: He is not ill-appearing.  ?Cardiovascular:  ?   Rate and Rhythm: Normal rate and regular rhythm.  ?Pulmonary:  ?   Effort: Pulmonary effort is normal.  ?   Breath sounds: Normal breath sounds.  ?Skin: ?   General: Skin is warm and dry.  ?Neurological:  ?   Mental Status: He is alert and oriented to person,  place, and time.  ?   Cranial Nerves: No cranial nerve deficit.  ?   Sensory: No sensory deficit.  ?   Motor: No weakness.  ?   Gait: Gait normal.  ?Psychiatric:     ?   Mood and Affect: Mood normal.     ?   Speech: Speech normal.     ?   Behavior: Behavior normal.  ? ? ? ?  ? ?Assessment & Plan:  ? ?Problem List Items Addressed This Visit   ?None ?Visit Diagnoses   ? ? Urinary frequency    -  Primary  ? Relevant Orders  ? CBC with Differential/Platelet  ? Hemoglobin A1c  ? Urinalysis, Routine w reflex microscopic  ? Severe obesity (BMI >= 40) (HCC)      ? Relevant Orders  ? CBC with Differential/Platelet  ? Hemoglobin A1c  ? Lipid panel  ? TSH  ? T4, free  ? Family history of diabetes mellitus in father      ? Relevant Orders  ? Hemoglobin A1c  ? Dry mouth      ? Relevant Orders  ? CBC with Differential/Platelet  ? Comprehensive metabolic panel  ?  Hemoglobin A1c  ? TSH  ? T4, free  ? ?  ? ?He is a pleasant 26 year old male who is new to the practice.  Concerns regarding recent symptoms that he thinks may be related to prediabetes or diabetes.  He would like to be screened for diabetes.  Continue with a healthy low sugar, low-carb diet and getting adequate physical activity.  Follow-up pending lab results ? ?No follow-ups on file.  ? ?Hetty Blend, NP-C ? ? ?

## 2022-01-14 ENCOUNTER — Telehealth: Payer: Self-pay | Admitting: Family Medicine

## 2022-01-14 ENCOUNTER — Other Ambulatory Visit: Payer: Self-pay | Admitting: Family Medicine

## 2022-01-14 ENCOUNTER — Ambulatory Visit: Payer: 59 | Admitting: Family Medicine

## 2022-01-14 ENCOUNTER — Encounter: Payer: Self-pay | Admitting: Family Medicine

## 2022-01-14 VITALS — BP 128/84 | HR 91 | Temp 97.7°F | Ht 69.0 in

## 2022-01-14 DIAGNOSIS — E1169 Type 2 diabetes mellitus with other specified complication: Secondary | ICD-10-CM | POA: Diagnosis not present

## 2022-01-14 DIAGNOSIS — E119 Type 2 diabetes mellitus without complications: Secondary | ICD-10-CM | POA: Diagnosis not present

## 2022-01-14 DIAGNOSIS — E785 Hyperlipidemia, unspecified: Secondary | ICD-10-CM

## 2022-01-14 HISTORY — DX: Type 2 diabetes mellitus without complications: E11.9

## 2022-01-14 LAB — HEMOGLOBIN A1C
Hgb A1c MFr Bld: 11.8 % of total Hgb — ABNORMAL HIGH (ref <5.7)
Mean Plasma Glucose: 292 mg/dL
eAG (mmol/L): 16.2 mmol/L

## 2022-01-14 MED ORDER — METFORMIN HCL 500 MG PO TABS: 500.0000 mg | ORAL_TABLET | Freq: Two times a day (BID) | ORAL | 0 refills | Status: DC

## 2022-01-14 NOTE — Progress Notes (Signed)
Please let him know that the test we did to screen for diabetes shows that he does have diabetes and his blood sugars are quite high. His Hgb A1c is 11.8% and while he is eating a healthier diet and exercising more, I still recommend starting on medication to lower his blood sugars. Please schedule an in office visit to discuss this. Today is fine or next week if he cannot come in today. I can go ahead and send in a medication for him to start taking.

## 2022-01-14 NOTE — Telephone Encounter (Signed)
Pt returned call regarding lab results and would like his medication sent to the [facility] off [address] ?

## 2022-01-14 NOTE — Telephone Encounter (Signed)
Pt r/s OV to today, PCP states she will discuss at visit ?

## 2022-01-14 NOTE — Progress Notes (Signed)
? ?Subjective:  ? ? ? Patient ID: Dalton Hobbs, male    DOB: 18-May-1996, 26 y.o.   MRN: [id number] ? ?Chief Complaint  ?Patient presents with  ? Follow-up  ? ? ?HPI ?Patient is in today for a follow up on new diagnosis of diabetes. Hgb A1c 11.8%.  ?He was symptomatic a few weeks ago with dry mouth, urinary frequency and blurred vision. He made several changes to his diet and these symptoms have resolved.  ? ?Reports feeling overwhelmed by this diagnosis but has been doing research and states he is willing to manage his condition.  ? ? ? ?Health Maintenance Due  ?Topic Date Due  ? FOOT EXAM  Never done  ? OPHTHALMOLOGY EXAM  Never done  ? URINE MICROALBUMIN  Never done  ? HIV Screening  Never done  ? Hepatitis C Screening  Never done  ? TETANUS/TDAP  Never done  ? ? ?Past Medical History:  ?Diagnosis Date  ? Headache   ? New onset type 2 diabetes mellitus ([place]) 01/14/2022  ? ? ?Past Surgical History:  ?Procedure Laterality Date  ? APPENDECTOMY  06/09/2016  ? 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 LAPAROSCOPIC APPENDECTOMY N/A 06/09/2016  ? Procedure: APPENDECTOMY LAPAROSCOPIC;  Surgeon: Coralie Keens, MD;  Location: [facility];  Service: General;  Laterality: N/A;  ? ? ?Family History  ?Problem Relation Age of Onset  ? Diabetes Father   ? ? ?Social History  ? ?Socioeconomic History  ? Marital status: Single  ?  Spouse name: Not on file  ? Number of children: Not on file  ? Years of education: Not on file  ? Highest education level: Not on file  ?Occupational History  ? Not on file  ?Tobacco Use  ? Smoking status: Never  ? Smokeless tobacco: Never  ?Substance and Sexual Activity  ? Alcohol use: No  ? Drug use: No  ? Sexual activity: Not on file  ?Other Topics Concern  ? Not on file  ?Social History Narrative  ? Not on file  ? ?Social Determinants of Health  ? ?Financial Resource Strain: Not on file  ?Food Insecurity: Not on file  ?Transportation Needs: Not on file  ?Physical Activity: Not on file  ?Stress: Not on file   ?Social Connections: Not on file  ?Intimate Partner Violence: Not on file  ? ? ?Outpatient Medications Prior to Visit  ?Medication Sig Dispense Refill  ? metFORMIN (GLUCOPHAGE) 500 MG tablet Take 1 tablet (500 mg total) by mouth 2 (two) times daily with a meal. (Patient not taking: Reported on 01/14/2022) 180 tablet 0  ? ?No facility-administered medications prior to visit.  ? ? ?No Known Allergies ? ?ROS ?Denies fever, chills, dizziness, chest pain, palpitations, shortness of breath, abdominal pain, N/V/D, urinary symptoms, LE edema.  ? ?   ?Objective:  ?  ?Physical Exam ? ?BP 128/84 (BP Location: Left Arm, Patient Position: Sitting, Cuff Size: Large)   Pulse 91   Temp 97.7 ?F (36.5 ?C) (Temporal)   Ht 5\' 9"  (1.753 m)   SpO2 98%   BMI 40.32 kg/m?  ?Wt Readings from Last 3 Encounters:  ?01/13/22 273 lb (123.8 kg)  ?06/09/16 228 lb (103.4 kg)  ? ?Alert and oriented and in no acute distress. Respirations unlabored. Normal mood.  ?   ?Assessment & Plan:  ? ?Problem List Items Addressed This Visit   ? ?  ? Endocrine  ? Hyperlipidemia associated with type 2 diabetes mellitus ([place])  ?  Relevant Orders  ? Amb ref to Medical Nutrition Therapy-MNT  ? New onset type 2 diabetes mellitus ([place]) - Primary  ? Relevant Orders  ? Amb ref to Medical Nutrition Therapy-MNT  ?  ? Other  ? Severe obesity (BMI >= 40) (HCC)  ? Relevant Orders  ? Amb ref to Medical Nutrition Therapy-MNT  ? ?Reviewed labs with patient including hemoglobin A1c of 11.8% and elevated LDL.  In-depth counseling on the diabetes spectrum and standards of care for patients with diabetes.  We discussed dietary information, how to check blood sugars, blood sugar goal ranges, and encouraged him to continue with healthy diet and exercise.  Metformin prescribed and we discussed potential side effects. ? ?Referral to MNT ?We discussed the potential need for an ACE or an ARB and statin down the road but did not initiate this today.  Discussed diabetic eye exam later  this year. ?Follow-up in 3 months or sooner if his blood sugars are not responding or if he becomes symptomatic again. ? ?I am having Rosetta Posner maintain his metFORMIN. ? ?No orders of the defined types were placed in this encounter. ? ? ?

## 2022-01-14 NOTE — Patient Instructions (Addendum)
Start metformin twice daily with meals as prescribed. ? ?Continue limiting your sugar and carbohydrates.  It is recommended that you have between 45 and 60 carbs per meal and no more than 15 carbs per snack. ? ?I am referring you to the medical nutritionist and they will call you to schedule. ? ?I recommend that you check your blood sugar at least once daily.  It can be fasting or 2 hours after a meal. ? ?Goal blood sugar range for fasting is between 90 and 130. ?Goal blood sugar range 2 hours after meals is 130-160. ? ? ?You can go to the American diabetes Association website to learn more information about type 2 diabetes. ? ?Follow-up in 3 months or sooner if needed. ? ? ?Diabetes Mellitus and Standards of Medical Care ?Living with and managing diabetes (diabetes mellitus) can be complicated. Your diabetes treatment may be managed by a team of health care providers, including: ?A physician who specializes in diabetes (endocrinologist). You might also have visits with a nurse practitioner or physician assistant. ?Nurses. ?A registered dietitian. ?A certified diabetes care and education specialist. ?An exercise specialist. ?A pharmacist. ?An eye doctor. ?A foot specialist (podiatrist). ?A dental care provider. ?A primary care provider. ?A mental health care provider. ?How to manage your diabetes ?You can do many things to successfully manage your diabetes. Your health care providers will follow guidelines to help you get the best quality of care. Here are general guidelines for your diabetes management plan. Your health care providers may give you more specific instructions. ?Physical exams ?When you are diagnosed with diabetes, and each year after that, your health care provider will ask about your medical and family history. You will have a physical exam, which may include: ?Measuring your height, weight, and body mass index (BMI). ?Checking your blood pressure. This will be done at every routine medical visit.  Your target blood pressure may vary depending on your medical conditions, your age, and other factors. ?A thyroid exam. ?A skin exam. ?Screening for nerve damage (peripheral neuropathy). This may include checking the pulse in your legs and feet and the level of sensation in your hands and feet. ?A foot exam to inspect the structure and skin of your feet, including checking for cuts, bruises, redness, blisters, sores, or other problems. ?Screening for blood vessel (vascular) problems. This may include checking the pulse in your legs and feet and checking your temperature. ?Blood tests ?Depending on your treatment plan and your personal needs, you may have the following tests: ?Hemoglobin A1C (HbA1C). This test provides information about blood sugar (glucose) control over the previous 2-3 months. It is used to adjust your treatment plan, if needed. This test will be done: ?At least 2 times a year, if you are meeting your treatment goals. ?4 times a year, if you are not meeting your treatment goals or if your goals have changed. ?Lipid testing, including total cholesterol, LDL and HDL cholesterol, and triglyceride levels. ?The goal for LDL is less than 100 mg/dL (5.5 mmol/L). If you are at high risk for complications, the goal is less than 70 mg/dL (3.9 mmol/L). ?The goal for HDL is 40 mg/dL (2.2 mmol/L) or higher for men, and 50 mg/dL (2.8 mmol/L) or higher for women. An HDL cholesterol of 60 mg/dL (3.3 mmol/L) or higher gives some protection against heart disease. ?The goal for triglycerides is less than 150 mg/dL (8.3 mmol/L). ?Liver function tests. ?Kidney function tests. ?Thyroid function tests. ? ?Dental and eye exams ? ?Visit  your dentist two times a year. ?If you have type 1 diabetes, your health care provider may recommend an eye exam within 5 years after you are diagnosed, and then once a year after your first exam. ?For children with type 1 diabetes, the health care provider may recommend an eye exam when  your child is age 43 or older and has had diabetes for 3-5 years. After the first exam, your child should get an eye exam once a year. ?If you have type 2 diabetes, your health care provider may recommend an eye exam as soon as you are diagnosed, and then every 1-2 years after your first exam. ?Immunizations ?A yearly flu (influenza) vaccine is recommended annually for everyone 6 months or older. This is especially important if you have diabetes. ?The pneumonia (pneumococcal) vaccine is recommended for everyone 2 years or older who has diabetes. If you are age 53 or older, you may get the pneumonia vaccine as a series of two separate shots. ?The hepatitis B vaccine is recommended for adults shortly after being diagnosed with diabetes. ?Adults and children with diabetes should receive all other vaccines according to age-specific recommendations from the Centers for Disease Control and Prevention (CDC). ?Mental and emotional health ?Screening for symptoms of eating disorders, anxiety, and depression is recommended at the time of diagnosis and after as needed. If your screening shows that you have symptoms, you may need more evaluation. You may work with a mental health care provider. ?Follow these instructions at home: ?Treatment plan ?You will monitor your blood glucose levels and may give yourself insulin. Your treatment plan will be reviewed at every medical visit. You and your health care provider will discuss: ?How you are taking your medicines, including insulin. ?Any side effects you have. ?Your blood glucose level target goals. ?How often you monitor your blood glucose level. ?Lifestyle habits, such as activity level and tobacco, alcohol, and substance use. ?Education ?Your health care provider will assess how well you are monitoring your blood glucose levels and whether you are taking your insulin and medicines correctly. He or she may refer you to: ?A certified diabetes care and education specialist to  manage your diabetes throughout your life, starting at diagnosis. ?A registered dietitian who can create and review your personal nutrition plan. ?An exercise specialist who can discuss your activity level and exercise plan. ?General instructions ?Take over-the-counter and prescription medicines only as told by your health care provider. ?Keep all follow-up visits. This is important. ?Where to find support ?There are many diabetes support networks, including: ?American Diabetes Association (ADA): diabetes.org ?Defeat Diabetes Foundation: defeatdiabetes.org ?Where to find more information ?American Diabetes Association (ADA): www.diabetes.org ?Association of Diabetes Care & Education Specialists (ADCES): diabeteseducator.org ?International Diabetes Federation (IDF): http://hill.biz/ ?Summary ?Managing diabetes (diabetes mellitus) can be complicated. Your diabetes treatment may be managed by a team of health care providers. ?Your health care providers follow guidelines to help you get the best quality care. ?You should have physical exams, blood tests, blood pressure monitoring, immunizations, and screening tests regularly. Stay updated on how to manage your diabetes. ?Your health care providers may also give you more specific instructions based on your individual health. ?This information is not intended to replace advice given to you by your health care provider. Make sure you discuss any questions you have with your health care provider. ?Document Revised: 02/27/2020 Document Reviewed: 02/27/2020 ?Elsevier Patient Education ? 2023 Elsevier Inc. ? ?

## 2022-01-14 NOTE — Telephone Encounter (Signed)
Pt returned call to office.  ? ?I read messag from provider to pt and he expressed understanding.  ? ?Scheduled an office visit appointment bext week./ Pt would like medication sent to  ?[id number] [place], [place] - [address] AT [facility] [address] [facility] [address] Phone:  [phone]  ?Fax:  [phone]  ?  ? ?

## 2022-01-20 ENCOUNTER — Ambulatory Visit: Payer: 59 | Admitting: Family Medicine

## 2022-03-24 ENCOUNTER — Other Ambulatory Visit: Payer: Self-pay

## 2022-03-24 DIAGNOSIS — E119 Type 2 diabetes mellitus without complications: Secondary | ICD-10-CM

## 2022-03-24 MED ORDER — METFORMIN HCL 500 MG PO TABS: 500.0000 mg | ORAL_TABLET | Freq: Two times a day (BID) | ORAL | 0 refills | Status: DC

## 2022-03-24 NOTE — Progress Notes (Signed)
Received fax from [facility] requesting Metformin refill. Sent in 30 day prescription with note that patient needs OV for more refills.

## 2022-03-31 ENCOUNTER — Other Ambulatory Visit: Payer: Self-pay | Admitting: Family Medicine

## 2022-03-31 DIAGNOSIS — E119 Type 2 diabetes mellitus without complications: Secondary | ICD-10-CM

## 2022-12-27 ENCOUNTER — Other Ambulatory Visit: Payer: Self-pay | Admitting: Family Medicine

## 2022-12-27 DIAGNOSIS — E119 Type 2 diabetes mellitus without complications: Secondary | ICD-10-CM

## 2023-01-25 ENCOUNTER — Other Ambulatory Visit: Payer: Self-pay | Admitting: Family Medicine

## 2023-01-25 DIAGNOSIS — E119 Type 2 diabetes mellitus without complications: Secondary | ICD-10-CM

## 2023-04-17 ENCOUNTER — Encounter (HOSPITAL_COMMUNITY): Payer: Self-pay

## 2023-04-17 ENCOUNTER — Observation Stay (HOSPITAL_COMMUNITY)
Admission: EM | Admit: 2023-04-17 | Discharge: 2023-04-18 | Disposition: A | Discharge status: Home / Self Care | Payer: 59 | Attending: Internal Medicine | Admitting: Internal Medicine

## 2023-04-17 ENCOUNTER — Other Ambulatory Visit: Payer: Self-pay

## 2023-04-17 ENCOUNTER — Emergency Department (HOSPITAL_BASED_OUTPATIENT_CLINIC_OR_DEPARTMENT_OTHER): Payer: 59

## 2023-04-17 ENCOUNTER — Emergency Department (HOSPITAL_COMMUNITY): Payer: 59

## 2023-04-17 DIAGNOSIS — I452 Bifascicular block: Secondary | ICD-10-CM

## 2023-04-17 DIAGNOSIS — E7800 Pure hypercholesterolemia, unspecified: Secondary | ICD-10-CM | POA: Diagnosis not present

## 2023-04-17 DIAGNOSIS — E119 Type 2 diabetes mellitus without complications: Secondary | ICD-10-CM | POA: Insufficient documentation

## 2023-04-17 DIAGNOSIS — E876 Hypokalemia: Secondary | ICD-10-CM | POA: Diagnosis not present

## 2023-04-17 DIAGNOSIS — R7989 Other specified abnormal findings of blood chemistry: Secondary | ICD-10-CM | POA: Diagnosis present

## 2023-04-17 DIAGNOSIS — E669 Obesity, unspecified: Secondary | ICD-10-CM | POA: Diagnosis present

## 2023-04-17 DIAGNOSIS — Z7984 Long term (current) use of oral hypoglycemic drugs: Secondary | ICD-10-CM | POA: Diagnosis not present

## 2023-04-17 DIAGNOSIS — E1169 Type 2 diabetes mellitus with other specified complication: Secondary | ICD-10-CM | POA: Diagnosis present

## 2023-04-17 DIAGNOSIS — I10 Essential (primary) hypertension: Secondary | ICD-10-CM

## 2023-04-17 DIAGNOSIS — I420 Dilated cardiomyopathy: Secondary | ICD-10-CM

## 2023-04-17 DIAGNOSIS — R609 Edema, unspecified: Secondary | ICD-10-CM | POA: Diagnosis not present

## 2023-04-17 DIAGNOSIS — R079 Chest pain, unspecified: Principal | ICD-10-CM

## 2023-04-17 DIAGNOSIS — E66812 Obesity, class 2: Secondary | ICD-10-CM | POA: Diagnosis present

## 2023-04-17 DIAGNOSIS — R9431 Abnormal electrocardiogram [ECG] [EKG]: Principal | ICD-10-CM | POA: Insufficient documentation

## 2023-04-17 LAB — BASIC METABOLIC PANEL
Anion gap: 14 (ref 5–15)
BUN: 19 mg/dL (ref 6–20)
CO2: 19 mmol/L — ABNORMAL LOW (ref 22–32)
Calcium: 9.3 mg/dL (ref 8.9–10.3)
Chloride: 109 mmol/L (ref 98–111)
Creatinine, Ser: 0.98 mg/dL (ref 0.61–1.24)
GFR, Estimated: >60 mL/min (ref >60)
Glucose, Bld: 120 mg/dL — ABNORMAL HIGH (ref 70–99)
Potassium: 3.4 mmol/L — ABNORMAL LOW (ref 3.5–5.1)
Sodium: 142 mmol/L (ref 135–145)

## 2023-04-17 LAB — LIPID PANEL
Cholesterol: 156 mg/dL (ref 0–200)
HDL: 40 mg/dL — ABNORMAL LOW (ref >40)
LDL Cholesterol: 105 mg/dL — ABNORMAL HIGH (ref 0–99)
Total CHOL/HDL Ratio: 3.9 RATIO
Triglycerides: 57 mg/dL (ref <150)
VLDL: 11 mg/dL (ref 0–40)

## 2023-04-17 LAB — CBC
HCT: 45.3 % (ref 39.0–52.0)
Hemoglobin: 15 g/dL (ref 13.0–17.0)
MCH: 27.2 pg (ref 26.0–34.0)
MCHC: 33.1 g/dL (ref 30.0–36.0)
MCV: 82.2 fL (ref 80.0–100.0)
Platelets: 260 10*3/uL (ref 150–400)
RBC: 5.51 MIL/uL (ref 4.22–5.81)
RDW: 14 % (ref 11.5–15.5)
WBC: 5.7 10*3/uL (ref 4.0–10.5)
nRBC: 0 % (ref 0.0–0.2)

## 2023-04-17 LAB — TROPONIN I (HIGH SENSITIVITY)
Troponin I (High Sensitivity): 38 ng/L — ABNORMAL HIGH (ref <18)
Troponin I (High Sensitivity): 37 ng/L — ABNORMAL HIGH (ref <18)

## 2023-04-17 LAB — BRAIN NATRIURETIC PEPTIDE: B Natriuretic Peptide: 21.4 pg/mL (ref 0.0–100.0)

## 2023-04-17 LAB — GLUCOSE, CAPILLARY
Glucose-Capillary: 92 mg/dL (ref 70–99)
Glucose-Capillary: 100 mg/dL — ABNORMAL HIGH (ref 70–99)

## 2023-04-17 LAB — CBG MONITORING, ED
Glucose-Capillary: 118 mg/dL — ABNORMAL HIGH (ref 70–99)
Glucose-Capillary: 103 mg/dL — ABNORMAL HIGH (ref 70–99)
Glucose-Capillary: 104 mg/dL — ABNORMAL HIGH (ref 70–99)

## 2023-04-17 LAB — MAGNESIUM: Magnesium: 2.1 mg/dL (ref 1.7–2.4)

## 2023-04-17 LAB — D-DIMER, QUANTITATIVE: D-Dimer, Quant: <0.27 ug/mL-FEU (ref 0.00–0.50)

## 2023-04-17 MED ORDER — ONDANSETRON HCL 4 MG PO TABS: 4.0000 mg | ORAL_TABLET | Freq: Four times a day (QID) | ORAL | Status: DC | PRN

## 2023-04-17 MED ORDER — ASPIRIN 81 MG PO CHEW
324.0000 mg | CHEWABLE_TABLET | Freq: Once | ORAL | Status: AC
  Administered 2023-04-17: 324 mg via ORAL
  Filled 2023-04-17: qty 4

## 2023-04-17 MED ORDER — INSULIN ASPART 100 UNIT/ML IJ SOLN
0.0000 [IU] | Freq: Three times a day (TID) | INTRAMUSCULAR | Status: DC
  Filled 2023-04-17: qty 0.2

## 2023-04-17 MED ORDER — ENOXAPARIN SODIUM 40 MG/0.4ML IJ SOSY
40.0000 mg | PREFILLED_SYRINGE | INTRAMUSCULAR | Status: DC
  Administered 2023-04-17: 40 mg via SUBCUTANEOUS
  Filled 2023-04-17: qty 0.4

## 2023-04-17 MED ORDER — POTASSIUM CHLORIDE CRYS ER 20 MEQ PO TBCR
40.0000 meq | EXTENDED_RELEASE_TABLET | Freq: Once | ORAL | Status: AC
  Administered 2023-04-17: 40 meq via ORAL
  Filled 2023-04-17: qty 4

## 2023-04-17 MED ORDER — ACETAMINOPHEN 325 MG PO TABS: 650.0000 mg | ORAL_TABLET | Freq: Four times a day (QID) | ORAL | Status: DC | PRN

## 2023-04-17 MED ORDER — ENOXAPARIN SODIUM 40 MG/0.4ML IJ SOSY: 40.0000 mg | PREFILLED_SYRINGE | INTRAMUSCULAR | Status: DC

## 2023-04-17 MED ORDER — ONDANSETRON HCL 4 MG/2ML IJ SOLN: 4.0000 mg | Freq: Four times a day (QID) | INTRAMUSCULAR | Status: DC | PRN

## 2023-04-17 MED ORDER — ACETAMINOPHEN 325 MG PO TABS: 650.0000 mg | ORAL_TABLET | ORAL | Status: DC | PRN

## 2023-04-17 MED ORDER — PANTOPRAZOLE SODIUM 40 MG IV SOLR
40.0000 mg | Freq: Once | INTRAVENOUS | Status: AC
  Administered 2023-04-17: 40 mg via INTRAVENOUS
  Filled 2023-04-17: qty 10

## 2023-04-17 MED ORDER — POTASSIUM CHLORIDE CRYS ER 20 MEQ PO TBCR
40.0000 meq | EXTENDED_RELEASE_TABLET | Freq: Every day | ORAL | Status: AC
  Administered 2023-04-17: 40 meq via ORAL
  Filled 2023-04-17 (×2): qty 2

## 2023-04-17 MED ORDER — ACETAMINOPHEN 650 MG RE SUPP: 650.0000 mg | Freq: Four times a day (QID) | RECTAL | Status: DC | PRN

## 2023-04-17 MED ORDER — ASPIRIN 81 MG PO CHEW
CHEWABLE_TABLET | ORAL | Status: AC
  Filled 2023-04-17: qty 3

## 2023-04-17 NOTE — H&P (Signed)
History and Physical    Patient: Dalton Hobbs HKV:[id number] DOB: [birth year] DOA: 04/17/2023 DOS: the patient was seen and examined on 04/17/2023 PCP: Avanell Shackleton, NP-C  Patient coming from: Home  Chief Complaint:  Chief Complaint  Patient presents with   Chest Pain   HPI: Dalton Hobbs is a 27 y.o. male with medical history significant of class III obesity, type 2 diabetes, headaches who presented to the emergency department complaints of having chest pain since yesterday evening while he was in bed trying to go to sleep until this morning around 0630 when he spontaneously resolved.  He denied fever, chills, rhinorrhea, sore throat, wheezing or hemoptysis.  No chest pain, palpitations, diaphoresis, PND, orthopnea or pitting edema of the lower extremities.  No abdominal pain, nausea, emesis, diarrhea, constipation, melena or hematochezia.  No flank pain, dysuria, frequency or hematuria.  No polyuria, polydipsia, polyphagia or blurred vision.  He has been having a lot of stress recently.  Lab work: CBC was normal with a white count of 5.7, hemoglobin 15.0 g/dL platelets 564.  Unremarkable D-dimer.  Troponin was 38 and 37 ng/L.  Normal BNP.  BMP showed a potassium of 3.4 and CO2 of 19 mmol/L with a normal anion gap.  Glucose 120 mg deciliter.  The rest of the BMP measurements were normal.  Imaging: 2 view chest radiograph showing low volume film without acute cardiopulmonary finding.  No evidence of DVT Doppler US.   ED course: Initial vital signs were temperature 98.8 F, pulse 71, respirations 16, BP 133/83 mmHg and O2 sat 96% on room air.  The patient received 324 mg of aspirin in the emergency department.  I added pantoprazole 40 mg IVP and KCl 40 mEq p.o. x 1.  Review of Systems: As mentioned in the history of present illness. All other systems reviewed and are negative. Past Medical History:  Diagnosis Date   Headache    New onset type 2 diabetes mellitus (HCC) 01/14/2022    Past Surgical History:  Procedure Laterality Date   APPENDECTOMY  06/09/2016   heart defect surgery     as an infant    LAPAROSCOPIC APPENDECTOMY N/A 06/09/2016   Procedure: APPENDECTOMY LAPAROSCOPIC;  Surgeon: Abigail Miyamoto, MD;  Location: [facility] OR;  Service: General;  Laterality: N/A;   Social History:  reports that he has never smoked. He has never used smokeless tobacco. He reports that he does not drink alcohol and does not use drugs.  No Known Allergies  Family History  Problem Relation Age of Onset   Diabetes Father     Prior to Admission medications   Medication Sig Start Date End Date Taking? Authorizing Provider  metFORMIN (GLUCOPHAGE) 500 MG tablet Take 1 tablet (500 mg total) by mouth 2 (two) times daily with a meal. MUST SEE PROVIDER FOR FURTHER REFILLS. 12/28/22   Avanell Shackleton, NP-C    Physical Exam: Vitals:   04/17/23 0543 04/17/23 0725 04/17/23 1000 04/17/23 1007  BP:  (!) 144/86 132/88   Pulse:  78 67   Resp:  17 20   Temp:  98.2 F (36.8 C)  98 F (36.7 C)  TempSrc:  Oral  Oral  SpO2:  96% 95%   Weight: 127 kg     Height: 5\' 10"  (1.778 m)      Physical Exam Vitals and nursing note reviewed.  Constitutional:      General: He is awake. He is not in acute distress.    Appearance: He is  well-developed. He is morbidly obese.  HENT:     Head: Normocephalic.     Nose: No rhinorrhea.     Mouth/Throat:     Mouth: Mucous membranes are moist.  Eyes:     General: No scleral icterus.    Pupils: Pupils are equal, round, and reactive to light.  Neck:     Vascular: No JVD.  Cardiovascular:     Rate and Rhythm: Normal rate and regular rhythm.     Heart sounds: S1 normal and S2 normal.  Pulmonary:     Effort: Pulmonary effort is normal.     Breath sounds: Normal breath sounds.  Abdominal:     General: Bowel sounds are normal.     Palpations: Abdomen is soft.     Tenderness: There is no abdominal tenderness.  Musculoskeletal:     Cervical back: Neck  supple.     Right lower leg: No edema.     Left lower leg: No edema.  Skin:    General: Skin is warm and dry.  Neurological:     General: No focal deficit present.     Mental Status: He is alert and oriented to person, place, and time.  Psychiatric:        Mood and Affect: Mood normal.        Behavior: Behavior normal. Behavior is cooperative.     Data Reviewed:  Results are pending, will review when available.  EKG: Vent. rate 74 BPM PR interval 184 ms QRS duration 199 ms QT/QTcB 530/589 ms P-R-T axes 1 -83 54 Sinus rhythm Ventricular premature complex RBBB and LAFB  Assessment and Plan: Principal Problem:   Chest pain Observation/telemetry. Supplemental oxygen as needed. Continue daily aspirin. No significant troponin uptrend. Serial EKG. Obtain echocardiogram. Cardiology consult appreciated.  Active Problems:   Elevated troponin No significant elevation. No uptrend. Echocardiogram ordered.    Hyperlipidemia associated with type 2 diabetes mellitus (HCC) Check lipid panel. Would benefit from statin therapy. Will discuss prior to discharge.    Type 2 diabetes mellitus (HCC) Carbohydrate modified diet. CBG monitoring with RI SS. Check hemoglobin A1c.    Hypokalemia Replacing. Follow-up potassium level.    Class II obesity  Current BMI 39.48 kg/m. Continue lifestyle modifications. Follow-up closely with primary care provider.    Advance Care Planning:   Code Status: Full Code   Consults: Cardiology  Family Communication:   Severity of Illness: The appropriate patient status for this patient is OBSERVATION. Observation status is judged to be reasonable and necessary in order to provide the required intensity of service to ensure the patient's safety. The patient's presenting symptoms, physical exam findings, and initial radiographic and laboratory data in the context of their medical condition is felt to place them at decreased risk for further  clinical deterioration. Furthermore, it is anticipated that the patient will be medically stable for discharge from the hospital within 2 midnights of admission.   Author: Bobette Mo, MD 04/17/2023 12:08 PM  For on call review [email or web address].   This document was prepared using Dragon voice recognition software and may contain some unintended transcription errors.

## 2023-04-17 NOTE — Consult Note (Signed)
Cardiology Consultation   Patient ID: Dalton Hobbs MRN: [id number]; DOB: 1996-08-18  Admit date: 04/17/2023 Date of Consult: 04/17/2023  PCP:  Avanell Shackleton, NP-C   [facility] Providers Cardiologist: New to Dr. Mayford Knife   Patient Profile:   Dalton Hobbs is a 27 y.o. male with a hx of appendicitis status post appendectomy 06/2016, type 2 diabetes, obesity, hyperlipidemia, who is being seen 04/17/2023 for the evaluation of chest pain at the request of Dr Rhunette Croft.  History of Present Illness:   Dalton Hobbs with above past medical history presented to the ER today complaining of chest pain.  Patient recalls started having midsternal chest pain around 1 AM while trying to go to sleep. He describes it as chest tightness/mild discomfort as if he was stressed.  He had some right index finger tingling sensation at one point. He was not doing any exertional prior to that. Pain was persistent all night until 630am and it spontaneously resolved. He denied any chest pain at current time.  He states he has DM, no hx of cardiac disease. He denied family history of premature CAD.  He denied any exertional angina or shortness of breath historically.  Denied any history of CVA or CKD.  He denied tobacco use.  He has quit alcohol use over 1 year.  Denied any other illicit drug use.  He denied any allergy history.   Admission diagnostic reveal hypokalemia 3.4, bicarb 19, glucose 120.  High sensitive troponin 38>37.  CBC grossly unremarkable.  D-dimer negative.  BNP 21.4.  Chest x-ray revealed no acute finding. EKG revealed sinus rhythm 74 bpm, PVC, RBBB and LAFB.  Repeat EKG largely unchanged.  He was given aspirin 324 mg x 1 in ED.  Cardiology consult is requested for further evaluation.   Past Medical History:  Diagnosis Date   Headache    New onset type 2 diabetes mellitus (HCC) 01/14/2022    Past Surgical History:  Procedure Laterality Date   APPENDECTOMY  06/09/2016   heart  defect surgery     as an infant    LAPAROSCOPIC APPENDECTOMY N/A 06/09/2016   Procedure: APPENDECTOMY LAPAROSCOPIC;  Surgeon: Abigail Miyamoto, MD;  Location: [facility] OR;  Service: General;  Laterality: N/A;     Home Medications:  Prior to Admission medications   Medication Sig Start Date End Date Taking? Authorizing Provider  metFORMIN (GLUCOPHAGE) 500 MG tablet Take 1 tablet (500 mg total) by mouth 2 (two) times daily with a meal. MUST SEE PROVIDER FOR FURTHER REFILLS. 12/28/22   Avanell Shackleton, NP-C    Inpatient Medications: Scheduled Meds:  aspirin       Continuous Infusions:  PRN Meds: aspirin  Allergies:   No Known Allergies  Social History:   Social History   Socioeconomic History   Marital status: Single    Spouse name: Not on file   Number of children: Not on file   Years of education: Not on file   Highest education level: Not on file  Occupational History   Not on file  Tobacco Use   Smoking status: Never   Smokeless tobacco: Never  Substance and Sexual Activity   Alcohol use: No   Drug use: No   Sexual activity: Not on file  Other Topics Concern   Not on file  Social History Narrative   Not on file   Social Determinants of Health   Financial Resource Strain: Not on file  Food Insecurity: Not on file  Transportation  Needs: Not on file  Physical Activity: Not on file  Stress: Not on file  Social Connections: Not on file  Intimate Partner Violence: Not on file    Family History:    Family History  Problem Relation Age of Onset   Diabetes Father      ROS:  Constitutional: Denied fever, chills, malaise, night sweats Eyes: Denied vision change or loss Ears/Nose/Mouth/Throat: Denied ear ache, sore throat, coughing, sinus pain Cardiovascular: see HPI  Respiratory: denied shortness of breath Gastrointestinal: Denied nausea, vomiting, abdominal pain, diarrhea Genital/Urinary: Denied dysuria, hematuria, urinary frequency/urgency Musculoskeletal:  Denied muscle ache, joint pain, weakness Skin: Denied rash, wound Neuro: Denied headache, dizziness, syncope Psych: Denied history of depression/anxiety  Endocrine: history of diabetes    Physical Exam/Data:   Vitals:   04/17/23 0543 04/17/23 0725 04/17/23 1000 04/17/23 1007  BP:  (!) 144/86 132/88   Pulse:  78 67   Resp:  17 20   Temp:  98.2 F (36.8 C)  98 F (36.7 C)  TempSrc:  Oral  Oral  SpO2:  96% 95%   Weight: 127 kg     Height: 5\' 10"  (1.778 m)      No intake or output data in the 24 hours ending 04/17/23 1101    04/17/2023    5:43 AM 01/13/2022    2:27 PM 06/09/2016    3:28 AM  Last 3 Weights  Weight (lbs) 280 lb 273 lb 228 lb  Weight (kg) 127.007 kg 123.832 kg 103.42 kg     Body mass index is 40.18 kg/m.   Vitals:  Vitals:   04/17/23 1000 04/17/23 1007  BP: 132/88   Pulse: 67   Resp: 20   Temp:  98 F (36.7 C)  SpO2: 95%    General Appearance: In no apparent distress, laying in bed, well nourished  HEENT: Normocephalic, atraumatic. Neck: Supple, trachea midline, no JVDs Cardiovascular: Regular rate and rhythm, normal S1-S2,  no murmur Respiratory: Resting breathing unlabored, lungs sounds clear to auscultation bilaterally, no use of accessory muscles. On room air.  No wheezes, rales or rhonchi.   Gastrointestinal: Bowel sounds positive, abdomen soft, non-tender Extremities: Able to move all extremities in bed without difficulty, no edema of BLE Musculoskeletal: Normal muscle bulk and tone Skin: Intact, warm, dry. No rashes or petechiae noted in exposed areas.  Neurologic: Alert, oriented to person, place and time. Fluent speech, no cognitive deficit,  no gross focal neuro deficit Psychiatric: Normal affect. Mood is appropriate.     EKG:  The EKG was personally reviewed and demonstrates:    Serial EKGs today showed sinus rhythm 70-90s, RBBB, LAFB  Telemetry:  Telemetry was personally reviewed and demonstrates:    Sinus rhythm, BBB  Relevant CV  Studies:   N/A in the past   Laboratory Data:  High Sensitivity Troponin:   Recent Labs  Lab 04/17/23 0550 04/17/23 0740  TROPONINIHS 38* 37*     Chemistry Recent Labs  Lab 04/17/23 0550  NA 142  K 3.4*  CL 109  CO2 19*  GLUCOSE 120*  BUN 19  CREATININE 0.98  CALCIUM 9.3  GFRNONAA >60  ANIONGAP 14    No results for input(s): "PROT", "ALBUMIN", "AST", "ALT", "ALKPHOS", "BILITOT" in the last 168 hours. Lipids No results for input(s): "CHOL", "TRIG", "HDL", "LABVLDL", "LDLCALC", "CHOLHDL" in the last 168 hours.  Hematology Recent Labs  Lab 04/17/23 0550  WBC 5.7  RBC 5.51  HGB 15.0  HCT 45.3  MCV 82.2  MCH 27.2  MCHC 33.1  RDW 14.0  PLT 260   Thyroid No results for input(s): "TSH", "FREET4" in the last 168 hours.  BNP Recent Labs  Lab 04/17/23 0809  BNP 21.4    DDimer  Recent Labs  Lab 04/17/23 0809  DDIMER <0.27     Radiology/Studies:  VAS [place] LOWER EXTREMITY VENOUS (DVT) (7a-7p)  Result Date: 04/17/2023  Lower Venous DVT Study Patient Name:  RAYMAN HOEPNER  Date of Exam:   04/17/2023 Medical Rec #: [id number]         Accession #:    [id number] Date of Birth: Jun 27, 1996          Patient Gender: M Patient Age:   59 years Exam Location:  [facility] Procedure:      VAS [place] LOWER EXTREMITY VENOUS (DVT) Referring Phys: Janey Genta NANAVATI --------------------------------------------------------------------------------  Indications: Edema.  Risk Factors: None identified. Limitations: Poor ultrasound/tissue interface. Comparison Study: No prior studies. Performing Technologist: Chanda Busing RVT  Examination Guidelines: A complete evaluation includes B-mode imaging, spectral Doppler, color Doppler, and power Doppler as needed of all accessible portions of each vessel. Bilateral testing is considered an integral part of a complete examination. Limited examinations for reoccurring indications may be performed as noted. The reflux portion of the exam is performed  with the patient in reverse Trendelenburg.  +-----+---------------+---------+-----------+----------+--------------+ RIGHTCompressibilityPhasicitySpontaneityPropertiesThrombus Aging +-----+---------------+---------+-----------+----------+--------------+ CFV  Full           Yes      Yes                                 +-----+---------------+---------+-----------+----------+--------------+   +---------+---------------+---------+-----------+----------+-------------------+ LEFT     CompressibilityPhasicitySpontaneityPropertiesThrombus Aging      +---------+---------------+---------+-----------+----------+-------------------+ CFV      Full           Yes      Yes                                      +---------+---------------+---------+-----------+----------+-------------------+ SFJ      Full                                                             +---------+---------------+---------+-----------+----------+-------------------+ FV Prox  Full                                                             +---------+---------------+---------+-----------+----------+-------------------+ FV Mid   Full                                                             +---------+---------------+---------+-----------+----------+-------------------+ FV DistalFull                                                             +---------+---------------+---------+-----------+----------+-------------------+  PFV      Full                                                             +---------+---------------+---------+-----------+----------+-------------------+ POP      Full           Yes      Yes                                      +---------+---------------+---------+-----------+----------+-------------------+ PTV      Full                                                             +---------+---------------+---------+-----------+----------+-------------------+  PERO                                                  Not well visualized +---------+---------------+---------+-----------+----------+-------------------+    Summary: RIGHT: - No evidence of common femoral vein obstruction.  LEFT: - There is no evidence of deep vein thrombosis in the lower extremity. However, portions of this examination were limited- see technologist comments above.  - No cystic structure found in the popliteal fossa.  *See table(s) above for measurements and observations.    Preliminary    DG Chest 2 View  Result Date: 04/17/2023 CLINICAL DATA:  Chest pain. EXAM: CHEST - 2 VIEW COMPARISON:  None Available. FINDINGS: Low volume film. The cardio pericardial silhouette is upper normal to borderline enlarged and accentuated by the low volume film. The lungs are clear without focal pneumonia, edema, pneumothorax or pleural effusion. No acute bony abnormality. IMPRESSION: Low volume film without acute cardiopulmonary findings. Electronically Signed   By: Kennith Center M.D.   On: 04/17/2023 06:45     Assessment and Plan:   Chest pain with elevated troponin  - presented with resting chest discomfort at 1AM 8/12 and spontaneously resolved 630am, atypical  - Hs trop 38 >37, not consistent with ACS - EKG without acute ischemic finding, underlying conduction disease noted - he has risk factor for CAD including DM, obesity,  - Will check lipid panel, A1C, Echo - Will obtain gated CT  Hypokalemia Type 2 DM Snoring - per primary team     Risk Assessment/Risk Scores:       For questions or updates, please contact [facility] Please consult [email or web address] for contact info under    Signed, Cyndi Bender, NP  04/17/2023 11:01 AM

## 2023-04-17 NOTE — Plan of Care (Signed)

## 2023-04-17 NOTE — ED Notes (Signed)
Pt states chest pain has resolved.

## 2023-04-17 NOTE — ED Provider Notes (Addendum)
[facility]ERGENC[facility] DEPARTMENT AT [facility] Provider Note   CSN: [id number] Arrival date & time: 04/17/23  7829     History  Chief Complaint  Patient presents with   Chest Pain    Dalton Hobbs is a 27 y.o. male.  HPI     27 year old male comes in with chief complaint of chest tightness, throbbing sensation to his chest and associated shortness of breath.  Patient has past medical history of diabetes, hyperlipidemia and possibly OSA.  Indicates that the chest discomfort started last night around 1 AM.  Chest discomfort is described as tightness with palpitations of the heart.  He did not think his heart was racing.  He had associated shortness of breath.  He proceeded to fall asleep, but woke up again couple of hours later because of chest discomfort.  Symptoms resolved on their own around 6 AM.  Patient indicates that he also had associated right-sided tingling.  Patient noted to have left lower extremity swelling.  He states that the swelling started about a month or so ago.  Patient has a sedentary lifestyle including work.  He denies any history of DVT, PE.  He does indicate that he has had episodes of chest discomfort over the past couple of months, usually unprovoked.  However last week he was at a concert and was having to catch his breath with exertion.  Home Medications Prior to Admission medications   Medication Sig Start Date End Date Taking? Authorizing Provider  metFORMIN (GLUCOPHAGE) 500 MG tablet Take 1 tablet (500 mg total) by mouth 2 (two) times daily with a meal. MUST SEE PROVIDER FOR FURTHER REFILLS. 12/28/22   Avanell Shackleton, NP-C      Allergies    Patient has no known allergies.    Review of Systems   Review of Systems  All other systems reviewed and are negative.   Physical Exam Updated Vital Signs BP 132/88 (BP Location: Left Arm)   Pulse 67   Temp 98 F (36.7 C) (Oral)   Resp 20   Ht 5\' 10"  (1.778 m)   Wt 127 kg   SpO2 95%    BMI 40.18 kg/m  Physical Exam Vitals and nursing note reviewed.  Constitutional:      Appearance: He is well-developed.  HENT:     Head: Atraumatic.  Cardiovascular:     Rate and Rhythm: Normal rate.     Heart sounds: Normal heart sounds.  Pulmonary:     Effort: Pulmonary effort is normal.     Breath sounds: Normal breath sounds.  Musculoskeletal:     Cervical back: Neck supple.  Skin:    General: Skin is warm.  Neurological:     Mental Status: He is alert and oriented to person, place, and time.     ED Results / Procedures / Treatments   Labs (all labs ordered are listed, but only abnormal results are displayed) Labs Reviewed  BASIC METABOLIC PANEL - Abnormal; Notable for the following components:      Result Value   Potassium 3.4 (*)    CO2 19 (*)    Glucose, Bld 120 (*)    All other components within normal limits  LIPID PANEL - Abnormal; Notable for the following components:   HDL 40 (*)    LDL Cholesterol 105 (*)    All other components within normal limits  CBG MONITORING, ED - Abnormal; Notable for the following components:   Glucose-Capillary 118 (*)  All other components within normal limits  TROPONIN I (HIGH SENSITIVITY) - Abnormal; Notable for the following components:   Troponin I (High Sensitivity) 38 (*)    All other components within normal limits  TROPONIN I (HIGH SENSITIVITY) - Abnormal; Notable for the following components:   Troponin I (High Sensitivity) 37 (*)    All other components within normal limits  CBC  D-DIMER, QUANTITATIVE  BRAIN NATRIURETIC PEPTIDE  HEMOGLOBIN A1C    EKG EKG Interpretation Date/Time:  Monday April 17 2023 07:32:43 EDT Ventricular Rate:  76 PR Interval:  173 QRS Duration:  198 QT Interval:  464 QTC Calculation: 522 R Axis:   -83  Text Interpretation: Sinus rhythm RBBB and LAFB No acute changes Nonspecific ST and T wave abnormality No significant change since last tracing Confirmed by Derwood Kaplan  ([id number]) on 04/17/2023 7:47:54 AM    EKG Interpretation Date/Time:  Monday April 17 2023 07:32:43 EDT Ventricular Rate:  76 PR Interval:  173 QRS Duration:  198 QT Interval:  464 QTC Calculation: 522 R Axis:   -83  Text Interpretation: Sinus rhythm RBBB and LAFB No acute changes Nonspecific ST and T wave abnormality No significant change since last tracing Confirmed by Derwood Kaplan [phone]) on 04/17/2023 7:47:54 AM        Radiology VAS [place] LOWER EXTREMITY VENOUS (DVT) (7a-7p)  Result Date: 04/17/2023  Lower Venous DVT Study Patient Name:  Dalton Hobbs  Date of Exam:   04/17/2023 Medical Rec #: [id number]         Accession #:    [id number] Date of Birth: [birth year]          Patient Gender: M Patient Age:   46 years Exam Location:  [facility] Procedure:      VAS [place] LOWER EXTREMITY VENOUS (DVT) Referring Phys: Janey Genta [place] Lint --------------------------------------------------------------------------------  Indications: Edema.  Risk Factors: None identified. Limitations: Poor ultrasound/tissue interface. Comparison Study: No prior studies. Performing Technologist: Chanda Busing RVT  Examination Guidelines: A complete evaluation includes B-mode imaging, spectral Doppler, color Doppler, and power Doppler as needed of all accessible portions of each vessel. Bilateral testing is considered an integral part of a complete examination. Limited examinations for reoccurring indications may be performed as noted. The reflux portion of the exam is performed with the patient in reverse Trendelenburg.  +-----+---------------+---------+-----------+----------+--------------+ RIGHTCompressibilityPhasicitySpontaneityPropertiesThrombus Aging +-----+---------------+---------+-----------+----------+--------------+ CFV  Full           Yes      Yes                                 +-----+---------------+---------+-----------+----------+--------------+    +---------+---------------+---------+-----------+----------+-------------------+ LEFT     CompressibilityPhasicitySpontaneityPropertiesThrombus Aging      +---------+---------------+---------+-----------+----------+-------------------+ CFV      Full           Yes      Yes                                      +---------+---------------+---------+-----------+----------+-------------------+ SFJ      Full                                                             +---------+---------------+---------+-----------+----------+-------------------+  FV Prox  Full                                                             +---------+---------------+---------+-----------+----------+-------------------+ FV Mid   Full                                                             +---------+---------------+---------+-----------+----------+-------------------+ FV DistalFull                                                             +---------+---------------+---------+-----------+----------+-------------------+ PFV      Full                                                             +---------+---------------+---------+-----------+----------+-------------------+ POP      Full           Yes      Yes                                      +---------+---------------+---------+-----------+----------+-------------------+ PTV      Full                                                             +---------+---------------+---------+-----------+----------+-------------------+ PERO                                                  Not well visualized +---------+---------------+---------+-----------+----------+-------------------+    Summary: RIGHT: - No evidence of common femoral vein obstruction.  LEFT: - There is no evidence of deep vein thrombosis in the lower extremity. However, portions of this examination were limited- see technologist comments above.  - No cystic  structure found in the popliteal fossa.  *See table(s) above for measurements and observations.    Preliminary    DG Chest 2 View  Result Date: 04/17/2023 CLINICAL DATA:  Chest pain. EXAM: CHEST - 2 VIEW COMPARISON:  None Available. FINDINGS: Low volume film. The cardio pericardial silhouette is upper normal to borderline enlarged and accentuated by the low volume film. The lungs are clear without focal pneumonia, edema, pneumothorax or pleural effusion. No acute bony abnormality. IMPRESSION: Low volume film without acute cardiopulmonary findings. Electronically Signed   By: Kennith Center M.D.   On: 04/17/2023 06:45    Procedures .Critical Care  Performed  by: Derwood Kaplan, MD Authorized by: Derwood Kaplan, MD   Critical care provider statement:    Critical care time (minutes):  35   Critical care was necessary to treat or prevent imminent or life-threatening deterioration of the following conditions:  Circulatory failure   Critical care was time spent personally by me on the following activities:  Development of treatment plan with patient or surrogate, discussions with consultants, evaluation of patient's response to treatment, examination of patient, ordering and review of laboratory studies, ordering and review of radiographic studies, ordering and performing treatments and interventions, pulse oximetry, re-evaluation of patient's condition, review of old charts and obtaining history from patient or surrogate     Medications Ordered in ED Medications  aspirin chewable tablet 324 mg (324 mg Oral Given 04/17/23 0818)    ED Course/ Medical Decision Making/ A&P             HEART Score: 4                    Medical Decision Making Amount and/or Complexity of Data Reviewed Labs: ordered. Radiology: ordered.  Risk OTC drugs. Decision regarding hospitalization.  This patient presents to the ED with chief complaint(s) of chest tightness, heart palpitations with pertinent past  medical history of diabetes, hyperlipidemia and elevated BMI of 40.The complaint involves an extensive differential diagnosis and also carries with it a high risk of complications and morbidity.    The differential diagnosis includes : Currently patient is chest pain-free.  Differential diagnosis considered includes acute coronary syndrome, tacky dysrhythmia, pulmonary embolism, acute DVT, cellulitis of the lower extremity, lymphedema.  Basic labs have been ordered including troponin.  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  We do not have old EKGs to compare.  Initial high sensitive troponin is also 34. We will add D-dimer and ultrasound lower extremity DVT to the evaluation.   Independent labs interpretation:  The following labs were independently interpreted: Slightly elevated high sensitive troponin of 35.  Independent visualization and interpretation of imaging: - I independently visualized the following imaging with scope of interpretation limited to determining acute life threatening conditions related to emergency care: X-ray of the chest, which revealed no evidence of pneumothorax, severe cardiomegaly  Treatment and Reassessment: Patient reassessed.  Remains chest pain-free.  Second troponin is flat.  Patient's D-dimer is less than 0.27.  Ultrasound DVT is negative.  Given that there are some EKG changes, and ACS risk factors we will consult cardiology.  Consultation: - Consulted or discussed management/test interpretation with external professional: Cardiology service consulted at 9:05 AM.  They will come and see the patient.  12:03 PM Cardiology team recommends that patient be admitted to the hospital. They have ordered an echocardiogram for now, coronary CT likely tomorrow.  Final Clinical Impression(s) / ED Diagnoses Final diagnoses:  Abnormal EKG  Elevated troponin    Rx / DC Orders ED Discharge Orders     None            Derwood Kaplan, MD 04/17/23 1203

## 2023-04-17 NOTE — ED Notes (Signed)
ED TO INPATIENT HANDOFF REPORT  Name/Age/Gender Dalton Hobbs 27 y.o. male  Code Status    Code Status Orders  (From admission, onward)           Start     Ordered   04/17/23 1206  Full code  Continuous       Question:  By:  Answer:  Consent: discussion documented in EHR   04/17/23 1207           Code Status History     Date Active Date Inactive Code Status Order ID Comments User Context   04/17/2023 1207 04/17/2023 1207 Full Code [id number]  Bobette Mo, MD ED   06/09/2016 0834 06/09/2016 1407 Full Code [id number]  [facility] ED       Home/SNF/Other Home  Chief Complaint Chest pain [R07.9]  Level of Care/Admitting Diagnosis ED Disposition     ED Disposition  Admit   Condition  --   Comment  Hospital Area: [facility] [[id number]]  Level of Care: Telemetry [5]  Admit to tele based on following criteria: Monitor for Ischemic changes  May place patient in observation at [facility] or [facility] if equivalent level of care is available:: No  Covid Evaluation: Asymptomatic - no recent exposure (last 10 days) testing not required  Diagnosis: Chest pain [563875]  Admitting Physician: Bobette Mo [[id number]]  Attending Physician: Bobette Mo [[id number]]          Medical History Past Medical History:  Diagnosis Date   Headache    New onset type 2 diabetes mellitus (HCC) 01/14/2022    Allergies No Known Allergies  IV Location/Drains/Wounds Patient Lines/Drains/Airways Status     Active Line/Drains/Airways     Name Placement date Placement time Site Days   Peripheral IV 04/17/23 20 G 1" Anterior;Distal;Right;Upper Arm 04/17/23  0806  Arm  less than 1   Incision (Closed) 06/09/16 Abdomen Other (Comment) 06/09/16  1211  -- 2503   Incision - 3 Ports Abdomen 1: Umbilicus 2: Left;Lateral;Lower 3: Right;Lateral;Upper 06/09/16  1151  -- 2503            Labs/Imaging Results for orders placed or  performed during the hospital encounter of 04/17/23 (from the past 48 hour(s))  Basic metabolic panel     Status: Abnormal   Collection Time: 04/17/23  5:50 AM  Result Value Ref Range   Sodium 142 135 - 145 mmol/L   Potassium 3.4 (L) 3.5 - 5.1 mmol/L   Chloride 109 98 - 111 mmol/L   CO2 19 (L) 22 - 32 mmol/L   Glucose, Bld 120 (H) 70 - 99 mg/dL    Comment: Glucose reference range applies only to samples taken after fasting for at least 8 hours.   BUN 19 6 - 20 mg/dL   Creatinine, Ser 0.63 0.61 - 1.24 mg/dL   Calcium 9.3 8.9 - 01.6 mg/dL   GFR, Estimated >01 >09 mL/min    Comment: (NOTE) Calculated using the CKD-EPI Creatinine Equation (2021)    Anion gap 14 5 - 15    Comment: Performed at [facility], [address], [place], [place] [address]  CBC     Status: None   Collection Time: 04/17/23  5:50 AM  Result Value Ref Range   WBC 5.7 4.0 - 10.5 K/uL   RBC 5.51 4.22 - 5.81 MIL/uL   Hemoglobin 15.0 13.0 - 17.0 g/dL   HCT 73.2 20.2 - 54.2 %  MCV 82.2 80.0 - 100.0 fL   MCH 27.2 26.0 - 34.0 pg   MCHC 33.1 30.0 - 36.0 g/dL   RDW 91.4 78.2 - 95.6 %   Platelets 260 150 - 400 K/uL   nRBC 0.0 0.0 - 0.2 %    Comment: Performed at [facility], [address] [place], [place] [address]  Troponin I (High Sensitivity)     Status: Abnormal   Collection Time: 04/17/23  5:50 AM  Result Value Ref Range   Troponin I (High Sensitivity) 38 (H) <18 ng/L    Comment: (NOTE) Elevated high sensitivity troponin I (hsTnI) values and significant  changes across serial measurements may suggest ACS but many other  chronic and acute conditions are known to elevate hsTnI results.  Refer to the "Links" section for chest pain algorithms and additional  guidance. Performed at [facility], [address], [place], [place] [address]   Troponin I (High Sensitivity)     Status: Abnormal   Collection Time: 04/17/23  7:40 AM  Result Value Ref Range    Troponin I (High Sensitivity) 37 (H) <18 ng/L    Comment: (NOTE) Elevated high sensitivity troponin I (hsTnI) values and significant  changes across serial measurements may suggest ACS but many other  chronic and acute conditions are known to elevate hsTnI results.  Refer to the "Links" section for chest pain algorithms and additional  guidance. Performed at [facility], [address], [place], [place] [address]   D-dimer, quantitative     Status: None   Collection Time: 04/17/23  8:09 AM  Result Value Ref Range   D-Dimer, Quant <0.27 0.00 - 0.50 ug/mL-FEU    Comment: (NOTE) At the manufacturer cut-off value of 0.5 g/mL FEU, this assay has a negative predictive value of 95-100%.This assay is intended for use in conjunction with a clinical pretest probability (PTP) assessment model to exclude pulmonary embolism (PE) and deep venous thrombosis (DVT) in outpatients suspected of PE or DVT. Results should be correlated with clinical presentation. Performed at [facility], [address] [place], [place] [address]   Brain natriuretic peptide     Status: None   Collection Time: 04/17/23  8:09 AM  Result Value Ref Range   B Natriuretic Peptide 21.4 0.0 - 100.0 pg/mL    Comment: Performed at [facility], [address] [place], [place] [address]  CBG monitoring, ED     Status: Abnormal   Collection Time: 04/17/23 10:06 AM  Result Value Ref Range   Glucose-Capillary 118 (H) 70 - 99 mg/dL    Comment: Glucose reference range applies only to samples taken after fasting for at least 8 hours.  Lipid panel     Status: Abnormal   Collection Time: 04/17/23 11:20 AM  Result Value Ref Range   Cholesterol 156 0 - 200 mg/dL   Triglycerides 57 <102 mg/dL   HDL 40 (L) >72 mg/dL   Total CHOL/HDL Ratio 3.9 RATIO   VLDL 11 0 - 40 mg/dL   LDL Cholesterol 536 (H) 0 - 99 mg/dL    Comment:        Total Cholesterol/HDL:CHD Risk Coronary  Heart Disease Risk Table                     Men   Women  1/2 Average Risk   3.4   3.3  Average Risk       5.0  4.4  2 X Average Risk   9.6   7.1  3 X Average Risk  23.4   11.0        Use the calculated Patient Ratio above and the CHD Risk Table to determine the patient's CHD Risk.        ATP III CLASSIFICATION (LDL):  <100     mg/dL   Optimal  952-841  mg/dL   Near or Above                    Optimal  130-159  mg/dL   Borderline  324-401  mg/dL   High  >027     mg/dL   Very High Performed at [facility], [address] [place], [place] [address]   CBG monitoring, ED     Status: Abnormal   Collection Time: 04/17/23  2:21 PM  Result Value Ref Range   Glucose-Capillary 103 (H) 70 - 99 mg/dL    Comment: Glucose reference range applies only to samples taken after fasting for at least 8 hours.   VAS [place] LOWER EXTREMITY VENOUS (DVT) (7a-7p)  Result Date: 04/17/2023  Lower Venous DVT Study Patient Name:  MERLE RAJENDRAN  Date of Exam:   04/17/2023 Medical Rec #: [id number]         Accession #:    [id number] Date of Birth: 08-26-1996          Patient Gender: M Patient Age:   34 years Exam Location:  [facility] Procedure:      VAS [place] LOWER EXTREMITY VENOUS (DVT) Referring Phys: Janey Genta NANAVATI --------------------------------------------------------------------------------  Indications: Edema.  Risk Factors: None identified. Limitations: Poor ultrasound/tissue interface. Comparison Study: No prior studies. Performing Technologist: Chanda Busing RVT  Examination Guidelines: A complete evaluation includes B-mode imaging, spectral Doppler, color Doppler, and power Doppler as needed of all accessible portions of each vessel. Bilateral testing is considered an integral part of a complete examination. Limited examinations for reoccurring indications may be performed as noted. The reflux portion of the exam is performed with the patient in reverse Trendelenburg.   +-----+---------------+---------+-----------+----------+--------------+ RIGHTCompressibilityPhasicitySpontaneityPropertiesThrombus Aging +-----+---------------+---------+-----------+----------+--------------+ CFV  Full           Yes      Yes                                 +-----+---------------+---------+-----------+----------+--------------+   +---------+---------------+---------+-----------+----------+-------------------+ LEFT     CompressibilityPhasicitySpontaneityPropertiesThrombus Aging      +---------+---------------+---------+-----------+----------+-------------------+ CFV      Full           Yes      Yes                                      +---------+---------------+---------+-----------+----------+-------------------+ SFJ      Full                                                             +---------+---------------+---------+-----------+----------+-------------------+ FV Prox  Full                                                             +---------+---------------+---------+-----------+----------+-------------------+  FV Mid   Full                                                             +---------+---------------+---------+-----------+----------+-------------------+ FV DistalFull                                                             +---------+---------------+---------+-----------+----------+-------------------+ PFV      Full                                                             +---------+---------------+---------+-----------+----------+-------------------+ POP      Full           Yes      Yes                                      +---------+---------------+---------+-----------+----------+-------------------+ PTV      Full                                                             +---------+---------------+---------+-----------+----------+-------------------+ PERO                                                   Not well visualized +---------+---------------+---------+-----------+----------+-------------------+    Summary: RIGHT: - No evidence of common femoral vein obstruction.  LEFT: - There is no evidence of deep vein thrombosis in the lower extremity. However, portions of this examination were limited- see technologist comments above.  - No cystic structure found in the popliteal fossa.  *See table(s) above for measurements and observations.    Preliminary    DG Chest 2 View  Result Date: 04/17/2023 CLINICAL DATA:  Chest pain. EXAM: CHEST - 2 VIEW COMPARISON:  None Available. FINDINGS: Low volume film. The cardio pericardial silhouette is upper normal to borderline enlarged and accentuated by the low volume film. The lungs are clear without focal pneumonia, edema, pneumothorax or pleural effusion. No acute bony abnormality. IMPRESSION: Low volume film without acute cardiopulmonary findings. Electronically Signed   By: Kennith Center M.D.   On: 04/17/2023 06:45    Pending Labs Unresulted Labs (From admission, onward)     Start     Ordered   04/18/23 0500  HIV Antibody (routine testing w rflx)  (HIV Antibody (Routine testing w reflex) panel)  Tomorrow morning,   R        04/17/23 1207   04/18/23 0500  CBC  Tomorrow morning,   R  04/17/23 1207   04/18/23 0500  Comprehensive metabolic panel  Tomorrow morning,   R        04/17/23 1207   04/17/23 1205  HIV Antibody (routine testing w rflx)  (HIV Antibody (Routine testing w reflex) panel)  Once,   R        04/17/23 1207   04/17/23 1204  Magnesium  Add-on,   AD        04/17/23 1204   04/17/23 1110  Hemoglobin A1c  Once,   URGENT        04/17/23 1109            Vitals/Pain Today's Vitals   04/17/23 0725 04/17/23 1000 04/17/23 1007 04/17/23 1350  BP: (!) 144/86 132/88  (!) 152/81  Pulse: 78 67  69  Resp: 17 20  11   Temp: 98.2 F (36.8 C)  98 F (36.7 C) 98.4 F (36.9 C)  TempSrc: Oral  Oral Oral  SpO2: 96% 95%  96%   Weight:      Height:      PainSc:        Isolation Precautions No active isolations  Medications Medications  potassium chloride SA (KLOR-CON M) CR tablet 40 mEq (has no administration in time range)  enoxaparin (LOVENOX) injection 40 mg (has no administration in time range)  acetaminophen (TYLENOL) tablet 650 mg (has no administration in time range)    Or  acetaminophen (TYLENOL) suppository 650 mg (has no administration in time range)  ondansetron (ZOFRAN) tablet 4 mg (has no administration in time range)    Or  ondansetron (ZOFRAN) injection 4 mg (has no administration in time range)  insulin aspart (novoLOG) injection 0-20 Units (has no administration in time range)  aspirin chewable tablet 324 mg (324 mg Oral Given 04/17/23 0818)  potassium chloride SA (KLOR-CON M) CR tablet 40 mEq (40 mEq Oral Given 04/17/23 1319)  pantoprazole (PROTONIX) injection 40 mg (40 mg Intravenous Given 04/17/23 1319)    Mobility walks

## 2023-04-17 NOTE — Progress Notes (Signed)
Left lower extremity venous duplex has been completed. Preliminary results can be found in CV Proc through chart review.  Results were given to Dr. Rhunette Croft.  04/17/23 9:30 AM Olen Cordial RVT

## 2023-04-17 NOTE — ED Triage Notes (Signed)
Pt presents via POV c/o tightness in his chest and "heart beat getting intense" this am. Reports has been having similar symptoms since June. Reports some lower leg swelling in April as well.

## 2023-04-18 ENCOUNTER — Observation Stay (HOSPITAL_BASED_OUTPATIENT_CLINIC_OR_DEPARTMENT_OTHER): Payer: 59

## 2023-04-18 DIAGNOSIS — I420 Dilated cardiomyopathy: Secondary | ICD-10-CM | POA: Diagnosis not present

## 2023-04-18 DIAGNOSIS — I10 Essential (primary) hypertension: Secondary | ICD-10-CM

## 2023-04-18 DIAGNOSIS — R072 Precordial pain: Secondary | ICD-10-CM

## 2023-04-18 DIAGNOSIS — R079 Chest pain, unspecified: Secondary | ICD-10-CM

## 2023-04-18 DIAGNOSIS — R9431 Abnormal electrocardiogram [ECG] [EKG]: Secondary | ICD-10-CM

## 2023-04-18 DIAGNOSIS — R7989 Other specified abnormal findings of blood chemistry: Secondary | ICD-10-CM | POA: Diagnosis not present

## 2023-04-18 LAB — GLUCOSE, CAPILLARY
Glucose-Capillary: 95 mg/dL (ref 70–99)
Glucose-Capillary: 92 mg/dL (ref 70–99)

## 2023-04-18 MED ORDER — NITROGLYCERIN 0.4 MG SL SUBL
0.8000 mg | SUBLINGUAL_TABLET | Freq: Once | SUBLINGUAL | Status: AC
  Administered 2023-04-18: 0.8 mg via SUBLINGUAL

## 2023-04-18 MED ORDER — PERFLUTREN LIPID MICROSPHERE
1.0000 mL | INTRAVENOUS | Status: AC | PRN
  Administered 2023-04-18: 2 mL via INTRAVENOUS

## 2023-04-18 MED ORDER — METOPROLOL TARTRATE 5 MG/5ML IV SOLN
10.0000 mg | Freq: Once | INTRAVENOUS | Status: AC | PRN
  Administered 2023-04-18: 5 mg via INTRAVENOUS

## 2023-04-18 MED ORDER — DILTIAZEM HCL 25 MG/5ML IV SOLN: 10.0000 mg | INTRAVENOUS | Status: DC | PRN

## 2023-04-18 MED ORDER — IOHEXOL 350 MG/ML SOLN
95.0000 mL | Freq: Once | INTRAVENOUS | Status: AC | PRN
  Administered 2023-04-18: 95 mL via INTRAVENOUS

## 2023-04-18 MED ORDER — LOSARTAN POTASSIUM 25 MG PO TABS: 25.0000 mg | ORAL_TABLET | Freq: Every day | ORAL | 0 refills | Status: DC

## 2023-04-18 MED ORDER — LOSARTAN POTASSIUM 25 MG PO TABS: 25.0000 mg | ORAL_TABLET | Freq: Every day | ORAL | Status: DC

## 2023-04-18 NOTE — TOC Initial Note (Signed)
Transition of Care [facility]) - Initial/Assessment Note    Patient Details  Name: Dalton Hobbs MRN: [id number] Date of Birth: 11/27/1995  Transition of Care [facility]) CM/SW Contact:    Lanier Clam, RN Phone Number: 04/18/2023, 4:49 PM  Clinical Narrative:   d/c home.                Expected Discharge Plan: Home/Self Care Barriers to Discharge: Continued Medical Work up   Patient Goals and CMS Choice Patient states their goals for this hospitalization and ongoing recovery are:: Home CMS Medicare.gov Compare Post Acute Care list provided to:: Patient Choice offered to / list presented to : Patient      Expected Discharge Plan and Services   Discharge Planning Services: CM Consult   Living arrangements for the past 2 months: Single Family Home Expected Discharge Date: 04/18/23                                    Prior Living Arrangements/Services Living arrangements for the past 2 months: Single Family Home Lives with:: Self   Do you feel safe going back to the place where you live?: Yes      Need for Family Participation in Patient Care: Yes (Comment) Care giver support system in place?: Yes (comment)   Criminal Activity/Legal Involvement Pertinent to Current Situation/Hospitalization: No - Comment as needed  Activities of Daily Living Home Assistive Devices/Equipment: None ADL Screening (condition at time of admission) Patient's cognitive ability adequate to safely complete daily activities?: Yes Is the patient deaf or have difficulty hearing?: No Does the patient have difficulty seeing, even when wearing glasses/contacts?: No Does the patient have difficulty concentrating, remembering, or making decisions?: No Patient able to express need for assistance with ADLs?: No Does the patient have difficulty dressing or bathing?: Yes Independently performs ADLs?: Yes (appropriate for developmental age) Does the patient have difficulty walking or climbing stairs?:  No Weakness of Legs: None Weakness of Arms/Hands: None  Permission Sought/Granted Permission sought to share information with : Case Manager Permission granted to share information with : Yes, Verbal Permission Granted  Share Information with NAME: Case Manager           Emotional Assessment Appearance:: Appears stated age Attitude/Demeanor/Rapport: Gracious Affect (typically observed): Accepting Orientation: : Oriented to Self, Oriented to Place, Oriented to  Time, Oriented to Situation Alcohol / Substance Use: Not Applicable Psych Involvement: No (comment)  Admission diagnosis:  Abnormal EKG [R94.31] Elevated troponin [R79.89] Chest pain [R07.9] Patient Active Problem List   Diagnosis Date Noted   Primary hypertension 04/18/2023   DCM (dilated cardiomyopathy) (HCC) 04/18/2023   Chest pain of uncertain etiology 04/17/2023   Type 2 diabetes mellitus (HCC) 04/17/2023   Hypokalemia 04/17/2023   Elevated troponin 04/17/2023   Class II obesity 04/17/2023   New onset type 2 diabetes mellitus (HCC) 01/14/2022   Hyperlipidemia associated with type 2 diabetes mellitus (HCC) 01/14/2022   Severe obesity (BMI >= 40) (HCC) 01/14/2022   Acute appendicitis 06/09/2016   Appendicitis 06/09/2016   PCP:  Avanell Shackleton, NP-C Pharmacy:   [facility] #[id number] [place], [place] - [address] AT [facility] [address] & [facility] [address] [place] [address] Phone: [phone] Fax: [phone]  [facility] #[id number] [place], [place] - [address] AT [facility] OF [facility] [address] & [facility] [address] [place] [address]  Phone: [phone] Fax: [phone]  [facility] - [place], [place] - [address] [place] [address] Phone: [phone] Fax: [phone]     Social Determinants of Health (SDOH) Social History: SDOH Screenings   Food Insecurity: No Food Insecurity (04/17/2023)  Housing:  Low Risk  (04/17/2023)  Transportation Needs: No Transportation Needs (04/17/2023)  Utilities: Not At Risk (04/17/2023)  Depression (PHQ2-9): Medium Risk (01/13/2022)  Tobacco Use: Low Risk  (04/17/2023)   SDOH Interventions:     Readmission Risk Interventions     No data to display

## 2023-04-18 NOTE — Progress Notes (Signed)
Follow up arranged on 05/03/23 with cardiology, see AVS, per Dr Mayford Knife request.

## 2023-04-18 NOTE — TOC Transition Note (Signed)
Transition of Care [facility]) - CM/SW Discharge Note   Patient Details  Name: Dalton Hobbs MRN: [id number] Date of Birth: August 27, 1996  Transition of Care [facility]) CM/SW Contact:  Lanier Clam, RN Phone Number: 04/18/2023, 4:50 PM   Clinical Narrative:d/c home No needs.       Final next level of care: Home/Self Care Barriers to Discharge: No Barriers Identified   Patient Goals and CMS Choice CMS Medicare.gov Compare Post Acute Care list provided to:: Patient Choice offered to / list presented to : Patient  Discharge Placement                         Discharge Plan and Services Additional resources added to the After Visit Summary for     Discharge Planning Services: CM Consult                                 Social Determinants of Health (SDOH) Interventions SDOH Screenings   Food Insecurity: No Food Insecurity (04/17/2023)  Housing: Low Risk  (04/17/2023)  Transportation Needs: No Transportation Needs (04/17/2023)  Utilities: Not At Risk (04/17/2023)  Depression (PHQ2-9): Medium Risk (01/13/2022)  Tobacco Use: Low Risk  (04/17/2023)     Readmission Risk Interventions     No data to display

## 2023-04-18 NOTE — Progress Notes (Signed)
  Echocardiogram 2D Echocardiogram has been performed.  Dalton Hobbs 04/18/2023, 8:44 AM

## 2023-04-18 NOTE — Progress Notes (Signed)
   Patient Name: Dalton Hobbs Date of Encounter: 04/18/2023 [facility] Cardiologist: New to Dr Mayford Knife  Interval Summary  .    Dalton Hobbs is a 27 y.o. male with a hx of appendicitis status post appendectomy 06/2016, type 2 diabetes, obesity, hyperlipidemia, cardiology is following for chest pain.  He reports resolved chest pain. He has been feeling well here. He is agreeable for CT.   Vital Signs .    Vitals:   04/17/23 1655 04/17/23 2100 04/18/23 0048 04/18/23 0511  BP: 135/85 (!) 139/94 128/84 128/88  Pulse: 75 70 67 65  Resp: 20     Temp: 98.7 F (37.1 C) 98.6 F (37 C) 98.5 F (36.9 C) (!) 97.5 F (36.4 C)  TempSrc: Oral Oral Oral Oral  SpO2: 98% 99% 98% 98%  Weight:      Height:        Intake/Output Summary (Last 24 hours) at 04/18/2023 0848 Last data filed at 04/17/2023 2000 Gross per 24 hour  Intake 240 ml  Output --  Net 240 ml      04/17/2023    4:52 PM 04/17/2023    5:43 AM 01/13/2022    2:27 PM  Last 3 Weights  Weight (lbs) 275 lb 2.2 oz 280 lb 273 lb  Weight (kg) 124.8 kg 127.007 kg 123.832 kg      Telemetry/ECG    Sinus rhythm 70s, occasional PVC - Personally Reviewed  Physical Exam .    GEN: No acute distress.  Obese.  Neck: No JVD Cardiac: RRR, no murmurs, rubs, or gallops.  Respiratory: Clear to auscultation bilaterally. GI: Soft, nontender, non-distended  MS: No leg edema  Assessment & Plan .     Chest pain with elevated troponin  - presented with resting chest discomfort at 1AM 8/12 and spontaneously resolved 630am, atypical  - Hs trop 38 >37, not consistent with ACS - EKG without acute ischemic finding, underlying conduction disease noted - he has risk factor for CAD including DM, obesity,  - A1C 6.1%, HDL 40 and LDL105, Echo pending  - Gated CT pending today    Hypokalemia Type 2 DM Snoring - per primary team      For questions or updates, please contact [facility] Please consult [email or web address]  for contact info under        Signed, Cyndi Bender, NP

## 2023-04-18 NOTE — Hospital Course (Signed)
27 y.o. male with medical history significant of class III obesity, type 2 diabetes, headaches who presented to the emergency department complaints of having chest pain since yesterday evening while he was in bed trying to go to sleep until this morning around 0630 when he spontaneously resolved.  He denied fever, chills, rhinorrhea, sore throat, wheezing or hemoptysis.  No chest pain, palpitations, diaphoresis, PND, orthopnea or pitting edema of the lower extremities.  No abdominal pain, nausea, emesis, diarrhea, constipation, melena or hematochezia.  No flank pain, dysuria, frequency or hematuria.  No polyuria, polydipsia, polyphagia or blurred vision.  He has been having a lot of stress recently.

## 2023-04-18 NOTE — Progress Notes (Signed)
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  As stated in my note earlier today adding losartan 25 mg daily which will help with not only LV dysfunction but also renal protective for his diabetes.  We will set up outpatient split-night sleep study for his morbid obesity.  He is stable for discharge home.  We will sign off.

## 2023-04-18 NOTE — Discharge Summary (Signed)
Physician Discharge Summary   Patient: Dalton Hobbs MRN: [id number] DOB: June 05, 1996  Admit date:     04/17/2023  Discharge date: 04/18/23  Discharge Physician: Rickey Barbara   PCP: Avanell Shackleton, NP-C   Recommendations at discharge:    Follow up with PCP in 1-2 weeks Follow up with outpatient sleep study as will be scheduled  Discharge Diagnoses: Principal Problem:   Chest pain of uncertain etiology Active Problems:   Hyperlipidemia associated with type 2 diabetes mellitus (HCC)   Severe obesity (BMI >= 40) (HCC)   Type 2 diabetes mellitus (HCC)   Hypokalemia   Elevated troponin   Class II obesity   Primary hypertension   DCM (dilated cardiomyopathy) (HCC)  Resolved Problems:   * No resolved hospital problems. *  Hospital Course: 27 y.o. male with medical history significant of class III obesity, type 2 diabetes, headaches who presented to the emergency department complaints of having chest pain since yesterday evening while he was in bed trying to go to sleep until this morning around 0630 when he spontaneously resolved.  He denied fever, chills, rhinorrhea, sore throat, wheezing or hemoptysis.  No chest pain, palpitations, diaphoresis, PND, orthopnea or pitting edema of the lower extremities.  No abdominal pain, nausea, emesis, diarrhea, constipation, melena or hematochezia.  No flank pain, dysuria, frequency or hematuria.  No polyuria, polydipsia, polyphagia or blurred vision.  He has been having a lot of stress recently.   Assessment and Plan: Principal Problem:   Chest pain Cardiology was consulted. 2d echo with EF of around 50% Pt underwent cardiac CT, reviewd, with coronary calcium score of 0 Cariology recommended adding losartan 25mg  daily Stable to d/c home per Cardiology   Active Problems:   Elevated troponin No significant elevation. No uptrend. Echocardiogram per above     Hyperlipidemia associated with type 2 diabetes mellitus (HCC) LD of 105      Type 2 diabetes mellitus (HCC) Carbohydrate modified diet. SSI while in hospital A1c noted to be 6.1     Hypokalemia Replaced     Class II obesity  Current BMI 39.48 kg/m. Continue lifestyle modifications. Follow-up closely with primary care provider.    Consultants: Cardiology Procedures performed: Cardiac CT  Disposition: Home Diet recommendation:  Carb modified diet DISCHARGE MEDICATION: Allergies as of 04/18/2023   No Known Allergies      Medication List     TAKE these medications    losartan 25 MG tablet Commonly known as: COZAAR Take 1 tablet (25 mg total) by mouth daily.   metFORMIN 500 MG tablet Commonly known as: GLUCOPHAGE Take 1 tablet (500 mg total) by mouth 2 (two) times daily with a meal. MUST SEE PROVIDER FOR FURTHER REFILLS.        Follow-up Information     Henson, Vickie L, NP-C Follow up in 2 week(s).   Specialty: Family Medicine Why: Hospital follow up Contact information: [address] [place] [address] [phone]         Gaston Islam., NP Follow up on 05/03/2023.   Specialty: Cardiology Why: at 2:20pm for your cardiology follow up appointment Contact information: [address] [place] [address] [phone]                Discharge Exam: Ceasar Mons Weights   04/17/23 0543 04/17/23 1652  Weight: 127 kg 124.8 kg   General exam: Awake, laying in bed, in nad Respiratory system: Normal respiratory effort, no wheezing Cardiovascular system: regular  rate, s1, s2 Gastrointestinal system: Soft, nondistended, positive BS Central nervous system: CN2-12 grossly intact, strength intact Extremities: Perfused, no clubbing Skin: Normal skin turgor, no notable skin lesions seen Psychiatry: Mood normal // no visual hallucinations   Condition at discharge: fair  The results of significant diagnostics from this hospitalization (including imaging, microbiology, ancillary and laboratory) are listed  below for reference.   Imaging Studies: CT CORONARY MORPH W/CTA COR W/SCORE W/CA W/CM &/OR WO/CM  Addendum Date: 04/18/2023   ADDENDUM REPORT: 04/18/2023 16:15 EXAM: OVER-READ INTERPRETATION  CT CHEST The following report is an over-read performed by radiologist Dr. Jacob Moores [facility] on 04/18/2023. This over-read does not include interpretation of cardiac or coronary anatomy or pathology. The coronary CTA interpretation by the cardiologist is attached. COMPARISON:  None. FINDINGS: Vascular: Normal caliber thoracic aorta with no atherosclerotic disease. Mediastinum/Nodes: Esophagus is unremarkable. No pathologically enlarged lymph nodes seen in the chest. Lungs/Pleura: Central airways are patent. No consolidation, pleural effusion or pneumothorax. Upper Abdomen: Hepatic steatosis.  No acute abnormality. Musculoskeletal: No chest wall mass or suspicious bone lesions identified. IMPRESSION: 1. No acute extracardiac abnormality. 2. Hepatic steatosis. Electronically Signed   By: Allegra Lai M.D.   On: 04/18/2023 16:15   Result Date: 04/18/2023 CLINICAL DATA:  This is a 27 year old male with anginal symptoms. EXAM: Cardiac/Coronary  CTA TECHNIQUE: The patient was scanned on a [employer]. FINDINGS: A 100 kV prospective scan was triggered in the descending thoracic aorta at 111 HU's. Axial non-contrast 3 mm slices were carried out through the heart. The data set was analyzed on a dedicated work station and scored using the Agatson method. Gantry rotation speed was 250 msecs and collimation was .6 mm. No beta blockade and 0.8 mg of sl NTG was given. The 3D data set was reconstructed in 5% intervals of the 67-82 % of the R-R cycle. Diastolic phases were analyzed on a dedicated work station using MPR, MIP and VRT modes. The patient received 80 cc of contrast. Aorta: Normal size. Mild aortic root calcifications. No dissection. Aortic Valve:  Trileaflet.  No calcifications.  Coronary Arteries:  Normal coronary origin.  Right dominance. RCA is a large dominant artery that gives rise to PDA and PLA. There is no plaque. Left main is a large artery that gives rise to LAD and LCX arteries. LAD is a large vessel that has no plaque. LCX is a non-dominant artery that gives rise to one large OM1 branch. There is no plaque. Coronary Calcium Score: Left main: 0 Left anterior descending artery: 0 Left circumflex artery: 0 Right coronary artery: 0 Total: 0 Percentile: 0 Other findings: Normal pulmonary vein drainage into the left atrium. Normal left atrial appendage without a thrombus. Normal size of the pulmonary artery. IMPRESSION: 1. Coronary calcium score of 0. This was 0 percentile for age and sex matched control. 2. Normal coronary origin with right dominance. 3. CAD-RADS 0. No evidence of CAD (0%). Consider non-atherosclerotic causes of chest pain. The noncardiac portion of this study will be interpreted in separate report by the radiologist. Electronically Signed: By: Thomasene Ripple D.O. On: 04/18/2023 15:03   ECHOCARDIOGRAM COMPLETE  Result Date: 04/18/2023    ECHOCARDIOGRAM REPORT   Patient Name:   Dalton Hobbs Date of Exam: 04/18/2023 Medical Rec #:  [id number]        Height:       70.0 in Accession #:    [id number]       Weight:  275.1 lb Date of Birth:  1995/12/05         BSA:          2.390 m Patient Age:    27 years         BP:           128/88 mmHg Patient Gender: M                HR:           70 bpm. Exam Location:  Inpatient Procedure: 2D Echo, Cardiac Doppler, Color Doppler and Intracardiac            Opacification Agent Indications:    Chest pain  History:        Patient has no prior history of Echocardiogram examinations.                 Risk Factors:Diabetes. Septal Repair:Amplazter.  Sonographer:    Milda Smart Referring Phys: [id number] Cyndi Bender  Sonographer Comments: Image acquisition challenging due to patient body habitus and Image acquisition challenging due to  respiratory motion. IMPRESSIONS  1. Left ventricular ejection fraction, by estimation, is 50%. The left ventricle has mildly decreased function. The left ventricle demonstrates global hypokinesis. Left ventricular diastolic parameters are indeterminate.  2. Right ventricular systolic function is normal. The right ventricular size is normal.  3. No evidence of erosion or residual shunting. Prior Septal Repair.  4. The mitral valve is normal in structure. No evidence of mitral valve regurgitation.  5. The aortic valve was not well visualized. Aortic valve regurgitation is trivial. No aortic stenosis is present. Comparison(s): No prior Echocardiogram. FINDINGS  Left Ventricle: Left ventricular ejection fraction, by estimation, is 50%. The left ventricle has mildly decreased function. The left ventricle demonstrates global hypokinesis. Definity contrast agent was given IV to delineate the left ventricular endocardial borders. The left ventricular internal cavity size was normal in size. There is no left ventricular hypertrophy. Left ventricular diastolic parameters are indeterminate. Right Ventricle: The right ventricular size is normal. No increase in right ventricular wall thickness. Right ventricular systolic function is normal. Left Atrium: Left atrial size was normal in size. Right Atrium: Right atrial size was normal in size. Pericardium: There is no evidence of pericardial effusion. Mitral Valve: The mitral valve is normal in structure. No evidence of mitral valve regurgitation. Tricuspid Valve: The tricuspid valve is normal in structure. Tricuspid valve regurgitation is trivial. No evidence of tricuspid stenosis. Aortic Valve: The aortic valve was not well visualized. Aortic valve regurgitation is trivial. No aortic stenosis is present. Pulmonic Valve: The pulmonic valve was grossly normal. Pulmonic valve regurgitation is mild. Aorta: The aortic root and ascending aorta are structurally normal, with no evidence  of dilitation. Pulmonary Artery: The pulmonary artery is of normal size. IAS/Shunts: Prior Septal Repair.  LEFT VENTRICLE PLAX 2D LVIDd:         5.60 cm      Diastology LVIDs:         3.70 cm      LV e' medial:    6.31 cm/s LV PW:         1.00 cm      LV E/e' medial:  11.1 LV IVS:        1.00 cm      LV e' lateral:   9.25 cm/s LVOT diam:     2.40 cm      LV E/e' lateral: 7.6 LV SV:  99 LV SV Index:   41 LVOT Area:     4.52 cm  LV Volumes (MOD) LV vol d, MOD A4C: 124.0 ml LV vol s, MOD A4C: 57.5 ml LV SV MOD A4C:     124.0 ml RIGHT VENTRICLE RV S prime:     5.68 cm/s TAPSE (M-mode): 1.3 cm LEFT ATRIUM             Index        RIGHT ATRIUM           Index LA diam:        3.60 cm 1.51 cm/m   RA Area:     12.20 cm LA Vol (A2C):   43.0 ml 17.99 ml/m  RA Volume:   24.90 ml  10.42 ml/m LA Vol (A4C):   20.8 ml 8.70 ml/m LA Biplane Vol: 27.3 ml 11.42 ml/m  AORTIC VALVE LVOT Vmax:   103.00 cm/s LVOT Vmean:  73.300 cm/s LVOT VTI:    0.218 m  AORTA Ao Root diam: 3.40 cm Ao Asc diam:  3.30 cm MITRAL VALVE               TRICUSPID VALVE MV Area (PHT): 2.95 cm    TR Peak grad:   24.6 mmHg MV Decel Time: 257 msec    TR Mean grad:   14.0 mmHg MR Peak grad: 4.8 mmHg     TR Vmax:        248.00 cm/s MR Vmax:      109.00 cm/s  TR Vmean:       179.0 cm/s MV E velocity: 70.30 cm/s MV A velocity: 49.30 cm/s  SHUNTS MV E/A ratio:  1.43        Systemic VTI:  0.22 m                            Systemic Diam: 2.40 cm Riley Lam MD Electronically signed by Riley Lam MD Signature Date/Time: 04/18/2023/8:59:38 AM    Final    VAS [place] LOWER EXTREMITY VENOUS (DVT) (7a-7p)  Result Date: 04/17/2023  Lower Venous DVT Study Patient Name:  Dalton Hobbs  Date of Exam:   04/17/2023 Medical Rec #: [id number]         Accession #:    [id number] Date of Birth: [birth year]          Patient Gender: M Patient Age:   46 years Exam Location:  [facility] Procedure:      VAS [place] LOWER EXTREMITY VENOUS (DVT) Referring Phys:  Janey Genta NANAVATI --------------------------------------------------------------------------------  Indications: Edema.  Risk Factors: None identified. Limitations: Poor ultrasound/tissue interface. Comparison Study: No prior studies. Performing Technologist: Chanda Busing RVT  Examination Guidelines: A complete evaluation includes B-mode imaging, spectral Doppler, color Doppler, and power Doppler as needed of all accessible portions of each vessel. Bilateral testing is considered an integral part of a complete examination. Limited examinations for reoccurring indications may be performed as noted. The reflux portion of the exam is performed with the patient in reverse Trendelenburg.  +-----+---------------+---------+-----------+----------+--------------+ RIGHTCompressibilityPhasicitySpontaneityPropertiesThrombus Aging +-----+---------------+---------+-----------+----------+--------------+ CFV  Full           Yes      Yes                                 +-----+---------------+---------+-----------+----------+--------------+   +---------+---------------+---------+-----------+----------+-------------------+ LEFT     CompressibilityPhasicitySpontaneityPropertiesThrombus Aging      +---------+---------------+---------+-----------+----------+-------------------+  CFV      Full           Yes      Yes                                      +---------+---------------+---------+-----------+----------+-------------------+ SFJ      Full                                                             +---------+---------------+---------+-----------+----------+-------------------+ FV Prox  Full                                                             +---------+---------------+---------+-----------+----------+-------------------+ FV Mid   Full                                                             +---------+---------------+---------+-----------+----------+-------------------+ FV  DistalFull                                                             +---------+---------------+---------+-----------+----------+-------------------+ PFV      Full                                                             +---------+---------------+---------+-----------+----------+-------------------+ POP      Full           Yes      Yes                                      +---------+---------------+---------+-----------+----------+-------------------+ PTV      Full                                                             +---------+---------------+---------+-----------+----------+-------------------+ PERO                                                  Not well visualized +---------+---------------+---------+-----------+----------+-------------------+    Summary: RIGHT: - No evidence of common femoral vein obstruction.  LEFT: - There is no evidence of deep vein  thrombosis in the lower extremity. However, portions of this examination were limited- see technologist comments above.  - No cystic structure found in the popliteal fossa.  *See table(s) above for measurements and observations. Electronically signed by Lemar Livings MD on 04/17/2023 at 6:58:54 PM.    Final    DG Chest 2 View  Result Date: 04/17/2023 CLINICAL DATA:  Chest pain. EXAM: CHEST - 2 VIEW COMPARISON:  None Available. FINDINGS: Low volume film. The cardio pericardial silhouette is upper normal to borderline enlarged and accentuated by the low volume film. The lungs are clear without focal pneumonia, edema, pneumothorax or pleural effusion. No acute bony abnormality. IMPRESSION: Low volume film without acute cardiopulmonary findings. Electronically Signed   By: Kennith Center M.D.   On: 04/17/2023 06:45    Microbiology: No results found for this or any previous visit.  Labs: CBC: Recent Labs  Lab 04/17/23 0550 04/18/23 0503  WBC 5.7 5.0  HGB 15.0 14.8  HCT 45.3 46.7  MCV 82.2 85.2  PLT 260  220   Basic Metabolic Panel: Recent Labs  Lab 04/17/23 0550 04/17/23 1712 04/18/23 0503  NA 142  --  139  K 3.4*  --  3.7  CL 109  --  107  CO2 19*  --  22  GLUCOSE 120*  --  95  BUN 19  --  15  CREATININE 0.98  --  0.69  CALCIUM 9.3  --  9.0  MG  --  2.1  --    Liver Function Tests: Recent Labs  Lab 04/18/23 0503  AST 27  ALT 41  ALKPHOS 44  BILITOT 1.2  PROT 7.4  ALBUMIN 4.2   CBG: Recent Labs  Lab 04/17/23 1641 04/17/23 1722 04/17/23 2102 04/18/23 0753 04/18/23 1107  GLUCAP 104* 92 100* 95 92    Discharge time spent: less than 30 minutes.  Signed: Rickey Barbara, MD [facility] 04/18/2023

## 2023-04-19 ENCOUNTER — Telehealth: Payer: Self-pay

## 2023-04-19 NOTE — Transitions of Care (Post Inpatient/ED Visit) (Signed)
   04/19/2023  Name: Dalton Hobbs MRN: [id number] DOB: 1996/07/12  Today's TOC FU Call Status: Today's TOC FU Call Status:: Successful TOC FU Call Completed TOC FU Call Complete Date: 04/19/23  Transition Care Management Follow-up Telephone Call Date of Discharge: 04/18/23 Discharge Facility: [facility]) Type of Discharge: Inpatient Admission Primary Inpatient Discharge Diagnosis:: Chest pain of uncertain etiology How have you been since you were released from the hospital?: Better Any questions or concerns?: Yes Patient Questions/Concerns:: (S) pt states he is going to cut back on metforming- thinks it may have caused his chest pain - he is not going to take metforming for a couple of days and see if it helps Patient Questions/Concerns Addressed: (S) Notified Provider of Patient Questions/Concerns  Items Reviewed: Did you receive and understand the discharge instructions provided?: Yes Medications obtained,verified, and reconciled?: Yes (Medications Reviewed) Any new allergies since your discharge?: No Dietary orders reviewed?: Yes Do you have support at home?: Yes  Medications Reviewed Today: Medications Reviewed Today     Reviewed by Merleen Nicely, LPN (Licensed Practical Nurse) on 04/19/23 at 1022  Med List Status: <None>   Medication Order Taking? Sig Documenting Provider Last Dose Status Informant  losartan (COZAAR) 25 MG tablet [id number] Yes Take 1 tablet (25 mg total) by mouth daily. Jerald Kief, MD Taking Active   metFORMIN (GLUCOPHAGE) 500 MG tablet [id number] Yes Take 1 tablet (500 mg total) by mouth 2 (two) times daily with a meal. MUST SEE PROVIDER FOR FURTHER REFILLS. Avanell Shackleton, NP-C Taking Active Self, Pharmacy Records  Med List Note Charlton Haws 06/09/16 0865): Doesn't have a pharmacy he uses regularly             Home Care and Equipment/Supplies: Were Home Health Services Ordered?: No Any new equipment or medical supplies  ordered?: No  Functional Questionnaire: Do you need assistance with bathing/showering or dressing?: No Do you need assistance with meal preparation?: No Do you need assistance with eating?: No Do you have difficulty maintaining continence: No Do you need assistance with getting out of bed/getting out of a chair/moving?: No Do you have difficulty managing or taking your medications?: No  Follow up appointments reviewed: PCP Follow-up appointment confirmed?: Yes MD Provider Line Number:[phone] Given: Yes Date of PCP follow-up appointment?: 04/27/23 Follow-up Provider: Hetty Blend NP-C Specialist Hospital Follow-up appointment confirmed?: Yes Date of Specialist follow-up appointment?: 05/03/23 Follow-Up Specialty Provider:: Dr Louanne Skye Do you need transportation to your follow-up appointment?: No Do you understand care options if your condition(s) worsen?: Yes-patient verbalized understanding    SIGNATURE  Woodfin Ganja LPN [facility] Nurse Health Advisor Direct Dial [phone]

## 2023-04-27 ENCOUNTER — Encounter: Payer: Self-pay | Admitting: Family Medicine

## 2023-04-27 ENCOUNTER — Ambulatory Visit: Payer: 59 | Admitting: Family Medicine

## 2023-04-27 VITALS — BP 130/94 | HR 80 | Temp 97.8°F | Ht 70.0 in | Wt 284.0 lb

## 2023-04-27 DIAGNOSIS — M79662 Pain in left lower leg: Secondary | ICD-10-CM

## 2023-04-27 DIAGNOSIS — E1169 Type 2 diabetes mellitus with other specified complication: Secondary | ICD-10-CM

## 2023-04-27 DIAGNOSIS — L819 Disorder of pigmentation, unspecified: Secondary | ICD-10-CM

## 2023-04-27 DIAGNOSIS — I10 Essential (primary) hypertension: Secondary | ICD-10-CM | POA: Diagnosis not present

## 2023-04-27 DIAGNOSIS — Z9189 Other specified personal risk factors, not elsewhere classified: Secondary | ICD-10-CM

## 2023-04-27 DIAGNOSIS — Z7984 Long term (current) use of oral hypoglycemic drugs: Secondary | ICD-10-CM

## 2023-04-27 DIAGNOSIS — R0683 Snoring: Secondary | ICD-10-CM

## 2023-04-27 DIAGNOSIS — M7989 Other specified soft tissue disorders: Secondary | ICD-10-CM

## 2023-04-27 NOTE — Progress Notes (Signed)
Subjective:     Patient ID: Dalton Hobbs, male    DOB: [birth year], 27 y.o.   MRN: [id number]  Chief Complaint  Patient presents with   Hospitalization Follow-up    Doing pretty good, did have a few instances where pressure in chest came back only after drinking caffine or poor sleep the night before. Trying to manage it that way    HPI  Discussed the use of AI scribe software for clinical note transcription with the patient, who gave verbal consent to proceed.  History of Present Illness         Here for follow up on hospital admission for atypical chest pain.   Diagnosed with diabetes with an A1c 11.8% in May 2023.  Most recent A1c 6.1%   Started losartan 25 mg 1 wk ago.   States he has reduced sugar and carbohydrates   No chest pain with exercise  Sleep test recommended by hospitalist per discharge summary. States he snores   Left leg swollen since March. Discoloration of calf. Pain at rest but not with walking.  Negative for DVT while hospitalized.     Health Maintenance Due  Topic Date Due   FOOT EXAM  Never done   OPHTHALMOLOGY EXAM  Never done   Diabetic kidney evaluation - Urine ACR  Never done   Hepatitis C Screening  Never done   DTaP/Tdap/Td (1 - Tdap) Never done   COVID-19 Vaccine (1 - 2023-24 season) Never done   INFLUENZA VACCINE  04/06/2023    Past Medical History:  Diagnosis Date   Headache    New onset type 2 diabetes mellitus (HCC) 01/14/2022    Past Surgical History:  Procedure Laterality Date   APPENDECTOMY  06/09/2016   heart defect surgery     as an infant    LAPAROSCOPIC APPENDECTOMY N/A 06/09/2016   Procedure: APPENDECTOMY LAPAROSCOPIC;  Surgeon: Abigail Miyamoto, MD;  Location: [facility] OR;  Service: General;  Laterality: N/A;    Family History  Problem Relation Age of Onset   Diabetes Father     Social History   Socioeconomic History   Marital status: Single    Spouse name: Not on file   Number of children: Not on file    Years of education: Not on file   Highest education level: Not on file  Occupational History   Not on file  Tobacco Use   Smoking status: Never   Smokeless tobacco: Never  Substance and Sexual Activity   Alcohol use: No   Drug use: No   Sexual activity: Not on file  Other Topics Concern   Not on file  Social History Narrative   Not on file   Social Determinants of Health   Financial Resource Strain: Not on file  Food Insecurity: No Food Insecurity (04/17/2023)   Hunger Vital Sign    Worried About Running Out of Food in the Last Year: Never true    Ran Out of Food in the Last Year: Never true  Transportation Needs: No Transportation Needs (04/17/2023)   PRAPARE - Administrator, Civil Service (Medical): No    Lack of Transportation (Non-Medical): No  Physical Activity: Not on file  Stress: Not on file  Social Connections: Not on file  Intimate Partner Violence: Not At Risk (04/17/2023)   Humiliation, Afraid, Rape, and Kick questionnaire    Fear of Current or Ex-Partner: No    Emotionally Abused: No    Physically Abused: No  Sexually Abused: No    Outpatient Medications Prior to Visit  Medication Sig Dispense Refill   losartan (COZAAR) 25 MG tablet Take 1 tablet (25 mg total) by mouth daily. 30 tablet 0   metFORMIN (GLUCOPHAGE) 500 MG tablet Take 1 tablet (500 mg total) by mouth 2 (two) times daily with a meal. MUST SEE PROVIDER FOR FURTHER REFILLS. 60 tablet 0   No facility-administered medications prior to visit.    No Known Allergies  Review of Systems  Constitutional:  Negative for chills, diaphoresis, fever and malaise/fatigue.  Respiratory:  Negative for shortness of breath.   Cardiovascular:  Positive for leg swelling. Negative for chest pain, palpitations, orthopnea and claudication.       6 months LLL  Gastrointestinal:  Negative for abdominal pain, constipation, diarrhea, nausea and vomiting.  Genitourinary:  Negative for dysuria, frequency and  urgency.  Skin:           Neurological:  Negative for dizziness and focal weakness.       Objective:    Physical Exam Constitutional:      General: He is not in acute distress.    Appearance: He is obese. He is not ill-appearing.  HENT:     Mouth/Throat:     Mouth: Mucous membranes are moist.     Pharynx: Oropharynx is clear.  Eyes:     Extraocular Movements: Extraocular movements intact.     Conjunctiva/sclera: Conjunctivae normal.  Cardiovascular:     Rate and Rhythm: Normal rate and regular rhythm.  Pulmonary:     Effort: Pulmonary effort is normal.     Breath sounds: Normal breath sounds.  Musculoskeletal:        General: No tenderness.     Cervical back: Normal range of motion and neck supple.     Left lower leg: Edema present.     Comments: Swelling of LLE, trace pitting edema. Pulse intact  Skin:    General: Skin is warm and dry.     Capillary Refill: Capillary refill takes less than 2 seconds.     Comments: Hyperpigmentation of posterior lower left leg  Neurological:     General: No focal deficit present.     Mental Status: He is alert and oriented to person, place, and time.     Sensory: No sensory deficit.     Motor: No weakness.     Coordination: Coordination normal.     Gait: Gait normal.  Psychiatric:        Mood and Affect: Mood normal.        Behavior: Behavior normal.        Thought Content: Thought content normal.      BP (!) 130/94 (BP Location: Left Arm, Patient Position: Sitting, Cuff Size: Large)   Pulse 80   Temp 97.8 F (36.6 C) (Temporal)   Ht 5\' 10"  (1.778 m)   Wt 284 lb (128.8 kg)   SpO2 95%   BMI 40.75 kg/m  Wt Readings from Last 3 Encounters:  04/27/23 284 lb (128.8 kg)  04/17/23 275 lb 2.2 oz (124.8 kg)  01/13/22 273 lb (123.8 kg)       Assessment & Plan:   Problem List Items Addressed This Visit       Cardiovascular and Mediastinum   Primary hypertension - Primary    Discussed good control of BP. He was started on  losartan 25 mg one week ago in the hospital. Will most likely need to increase medication. He will see cardiology  next week.  Encourage him to monitor BP at home.       Relevant Orders   Home sleep test     Endocrine   Type 2 diabetes mellitus with morbid obesity (HCC)    A1c recently 6.1% while hospitalized. Taking metformin 500 mg bid. Monitor BS. Low carbohydrate diet.         Musculoskeletal and Integument   Discoloration of skin of lower leg   Relevant Orders   Ambulatory referral to Vascular Surgery     Other   At risk for sleep apnea    HST ordered      Relevant Orders   Home sleep test   Pain and swelling of left lower leg    Negative for DVT while hospitalized. Referral to vascular for further evaluation       Relevant Orders   Ambulatory referral to Vascular Surgery   Severe obesity (BMI >= 40) (HCC)    Encourage healthy diet and increasing physical activity as tolerated. Consider GLP-1 at follow up      Snores   Relevant Orders   Home sleep test    I am having Savan R. Scherf maintain his metFORMIN and losartan.  No orders of the defined types were placed in this encounter.

## 2023-04-27 NOTE — Assessment & Plan Note (Signed)
Negative for DVT while hospitalized. Referral to vascular for further evaluation

## 2023-04-27 NOTE — Patient Instructions (Addendum)
I have referred you to vein and vascular.  They will call you to schedule a visit.  I also ordered a home sleep test.  You will hear from the [facility] sleep center to schedule this.  Continue your current medications.  I recommend that you start checking your blood pressure at home.  Follow-up with cardiology as scheduled next week.  Call and schedule a diabetes eye exam.     [facility] Phone: [phone]

## 2023-04-27 NOTE — Assessment & Plan Note (Signed)
Encourage healthy diet and increasing physical activity as tolerated. Consider GLP-1 at follow up

## 2023-04-27 NOTE — Assessment & Plan Note (Signed)
A1c recently 6.1% while hospitalized. Taking metformin 500 mg bid. Monitor BS. Low carbohydrate diet.

## 2023-04-27 NOTE — Assessment & Plan Note (Signed)
HST ordered.

## 2023-04-27 NOTE — Assessment & Plan Note (Signed)
Discussed good control of BP. He was started on losartan 25 mg one week ago in the hospital. Will most likely need to increase medication. He will see cardiology next week.  Encourage him to monitor BP at home.

## 2023-05-02 NOTE — Progress Notes (Unsigned)
Office Visit    Patient Name: Dalton Hobbs Date of Encounter: 05/02/2023  Primary Care Provider:  Avanell Shackleton, NP-C Primary Cardiologist:  None Primary Electrophysiologist: None   Past Medical History    Past Medical History:  Diagnosis Date   Headache    New onset type 2 diabetes mellitus (HCC) 01/14/2022   Past Surgical History:  Procedure Laterality Date   APPENDECTOMY  06/09/2016   heart defect surgery     as an infant    LAPAROSCOPIC APPENDECTOMY N/A 06/09/2016   Procedure: APPENDECTOMY LAPAROSCOPIC;  Surgeon: Abigail Miyamoto, MD;  Location: [facility] OR;  Service: General;  Laterality: N/A;    Allergies  No Known Allergies   History of Present Illness    Dalton Hobbs  is a 27 year old male with a PMH of dilated cardiomyopathy, prior ASD repair, DM type II, essential hypertension, HLD, RBBB and LAFB, obesity who presents today for posthospital follow-up.  [name] presented to the ED on 04/17/2023 with complaint of chest pain.  He endorsed chest tightness throbbing sensation in the middle of his chest that was associated with shortness of breath.  EKG was completed showing sinus rhythm with RBBB B and left anterior fascicular block.  Chest x-ray was negative and BNP was slightly elevated at 21.4 with negative D-dimer and troponins mildly elevated at 38.  2D echo was completed showing mildly reduced EF of 50% with global hypokinesis prior septal repair with no significant valvular abnormalities.  He underwent coronary CTA that showed calcium score 0 with no evidence of CAD.  He reported a history of snoring and will undergo outpatient sleep study to rule out OSA.  Since last being seen in the office patient reports he is doing much better since his hospital admission.  He reports some ongoing chest discomfort that occurs mainly at night when lying down.  He associates the discomfort with meals especially fried foods.  He denies any daytime discomfort or chest pain  with activity.  His blood pressure is well-controlled at 124/80 and heart rate is 82 bpm.  He is compliant with his current medications and denies any adverse reactions.  In discussing his heart history he had a ASD at 42 months old in [place].  Patient denies chest pain, palpitations, dyspnea, PND, orthopnea, nausea, vomiting, dizziness, syncope, edema, weight gain, or early satiety.   Home Medications    Current Outpatient Medications  Medication Sig Dispense Refill   losartan (COZAAR) 25 MG tablet Take 1 tablet (25 mg total) by mouth daily. 30 tablet 0   metFORMIN (GLUCOPHAGE) 500 MG tablet Take 1 tablet (500 mg total) by mouth 2 (two) times daily with a meal. MUST SEE PROVIDER FOR FURTHER REFILLS. 60 tablet 0   No current facility-administered medications for this visit.     Review of Systems  Please see the history of present illness.    (+) Daytime somnolence (+) Chest discomfort  All other systems reviewed and are otherwise negative except as noted above.  Physical Exam    Wt Readings from Last 3 Encounters:  04/27/23 284 lb (128.8 kg)  04/17/23 275 lb 2.2 oz (124.8 kg)  01/13/22 273 lb (123.8 kg)   ON:GEXBM were no vitals filed for this visit.,There is no height or weight on file to calculate BMI.  Constitutional:      Appearance: Healthy appearance. Not in distress.  Neck:     Vascular: JVD normal.  Pulmonary:  Effort: Pulmonary effort is normal.     Breath sounds: No wheezing. No rales. Diminished in the bases Cardiovascular:     Normal rate. Regular rhythm. Normal S1. Normal S2.      Murmurs: There is no murmur.  Edema:    Peripheral edema absent.  Abdominal:     Palpations: Abdomen is soft non tender. There is no hepatomegaly.  Skin:    General: Skin is warm and dry.  Neurological:     General: No focal deficit present.     Mental Status: Alert and oriented to person, place and time.     Cranial Nerves: Cranial nerves are intact.   EKG/LABS/ Recent Cardiac Studies    ECG personally reviewed by me today -none completed today   Sleep Apnea Evaluation  [facility]  Today's Date: 05/03/2023   Patient Name: Dalton Hobbs        DOB: November 04, 1995       Height:  5\' 10"  (1.778 m)     Weight: 281 lb (127.5 kg)  BMI: Body mass index is 40.32 kg/m.    Referring Provider:  [name], NP   STOP-BANG RISK ASSESSMENT       05/03/2023    3:06 PM  STOP-BANG  Do you snore loudly? Yes  Do you often feel tired, fatigued, or sleepy during the daytime? Yes  Has anyone observed you stop breathing during sleep? No  Do you have (or are you being treated for) high blood pressure? Yes  Recent BMI (Calculated) 40.32  Is BMI greater than 35 kg/m2? 1=Yes  Age older than 29 years old? 0=No  Has large neck size > 40 cm (15.7 in, large male shirt size, large male collar size > 16) Yes  Gender - Male 1=Yes  STOP-Bang Total Score 6      If STOP-BANG Score ?3 OR two clinical symptoms - patient qualifies for WatchPAT (CPT 95800)      Sleep study ordered due to two (2) of the following clinical symptoms/diagnoses:  Excessive daytime sleepiness G47.10  Gastroesophageal reflux K21.9  Nocturia R35.1  Morning Headaches G44.221  Difficulty concentrating R41.840  Memory problems or poor judgment G31.84  Personality changes or irritability R45.4  Loud snoring R06.83  Depression F32.9  Unrefreshed by sleep G47.8  Impotence N52.9  History of high blood pressure R03.0  Insomnia G47.00  Sleep Disordered Breathing or Sleep Apnea ICD G47.33      Lab Results  Component Value Date   WBC 5.0 04/18/2023   HGB 14.8 04/18/2023   HCT 46.7 04/18/2023   MCV 85.2 04/18/2023   PLT 220 04/18/2023   Lab Results  Component Value Date   CREATININE 0.69 04/18/2023   BUN 15 04/18/2023   NA 139 04/18/2023   K 3.7 04/18/2023   CL 107 04/18/2023   CO2 22 04/18/2023   Lab Results  Component Value Date   ALT 41  04/18/2023   AST 27 04/18/2023   ALKPHOS 44 04/18/2023   BILITOT 1.2 04/18/2023   Lab Results  Component Value Date   CHOL 156 04/17/2023   HDL 40 (L) 04/17/2023   LDLCALC 105 (H) 04/17/2023   TRIG 57 04/17/2023   CHOLHDL 3.9 04/17/2023    Lab Results  Component Value Date   HGBA1C 6.1 (H) 04/17/2023     Assessment & Plan    1.  Chest pain: -Patient had mild troponin spike with complaint of chest pain and underwent coronary CTA that showed  no evidence of obstruction and calcium score is 0 -Today patient reports ongoing discomfort that occurs while eating greasy foods and before lying down. -Patient was advised to reduce consumption of spicy and greasy foods and will try over-the-counter Tums or Rolaids. -He was also encouraged to sleep on an incline and contact our office if chest discomfort continues.  2.  Essential hypertension: -Patient's blood pressure today was controlled at 124/80 -Continue losartan 25 mg daily  3.  Hyperlipidemia: -Patient's last LDL cholesterol was 105 -We will continue to monitor and patient may start statin therapy due to DM type II if cholesterol numbers are elevated at next check  4.  Diabetes type 2: -Patient's last hemoglobin A1c was initially 11 and has improved to 6.1 -Continue current treatment plan per PCP  5.  Obesity: -Patient's BMI is 40.32 kg/m -Patient is motivated to lose weight and is interested in exercise activities.  6.  Dilated cardiomyopathy: -2D echo completed showing EF of 50% with global hypokinesis and previous ASD repair with RBBB noted on EKG  Disposition: Follow-up with None or APP in 6 months    Medication Adjustments/Labs and Tests Ordered: Current medicines are reviewed at length with the patient today.  Concerns regarding medicines are outlined above.   Signed, Napoleon Form, Leodis Rains, NP 05/02/2023, 11:33 AM [facility]

## 2023-05-03 ENCOUNTER — Encounter: Payer: Self-pay | Admitting: Nurse Practitioner

## 2023-05-03 ENCOUNTER — Telehealth: Payer: Self-pay | Admitting: *Deleted

## 2023-05-03 ENCOUNTER — Ambulatory Visit: Payer: 59 | Attending: Nurse Practitioner | Admitting: Nurse Practitioner

## 2023-05-03 VITALS — BP 124/80 | HR 82 | Ht 70.0 in | Wt 281.0 lb

## 2023-05-03 DIAGNOSIS — I420 Dilated cardiomyopathy: Secondary | ICD-10-CM | POA: Diagnosis not present

## 2023-05-03 DIAGNOSIS — I10 Essential (primary) hypertension: Secondary | ICD-10-CM

## 2023-05-03 DIAGNOSIS — R079 Chest pain, unspecified: Secondary | ICD-10-CM

## 2023-05-03 DIAGNOSIS — E1169 Type 2 diabetes mellitus with other specified complication: Secondary | ICD-10-CM

## 2023-05-03 DIAGNOSIS — R0683 Snoring: Secondary | ICD-10-CM

## 2023-05-03 DIAGNOSIS — Z7984 Long term (current) use of oral hypoglycemic drugs: Secondary | ICD-10-CM

## 2023-05-03 DIAGNOSIS — E785 Hyperlipidemia, unspecified: Secondary | ICD-10-CM

## 2023-05-03 DIAGNOSIS — G4730 Sleep apnea, unspecified: Secondary | ICD-10-CM

## 2023-05-03 NOTE — Patient Instructions (Signed)
Medication Instructions:  TRY Tums or Rolaids *If you need a refill on your cardiac medications before your next appointment, please call your pharmacy*   Lab Work: TODAY-BMET  If you have labs (blood work) drawn today and your tests are completely normal, you will receive your results only by: MyChart Message (if you have MyChart) OR A paper copy in the mail If you have any lab test that is abnormal or we need to change your treatment, we will call you to review the results.   Testing/Procedures: WGNFAO SLEEP STUDY   Follow-Up: At [facility], you and your health needs are our priority.  As part of our continuing mission to provide you with exceptional heart care, we have created designated Provider Care Teams.  These Care Teams include your primary Cardiologist (physician) and Advanced Practice Providers (APPs -  Physician Assistants and Nurse Practitioners) who all work together to provide you with the care you need, when you need it.  We recommend signing up for the patient portal called "MyChart".  Sign up information is provided on this After Visit Summary.  MyChart is used to connect with patients for Virtual Visits (Telemedicine).  Patients are able to view lab/test results, encounter notes, upcoming appointments, etc.  Non-urgent messages can be sent to your provider as well.   To learn more about what you can do with MyChart, go to [email or web address].    Your next appointment:   6 month(s)  Provider:   Armanda Magic, MD   Other Instructions

## 2023-05-03 NOTE — Telephone Encounter (Signed)
[name], NP ordered an Dalton Hobbs study. Pt is agreeable signed waiver and to not open the ox until he has been called with PIN#.

## 2023-05-04 ENCOUNTER — Other Ambulatory Visit: Payer: Self-pay | Admitting: Family Medicine

## 2023-05-04 DIAGNOSIS — E119 Type 2 diabetes mellitus without complications: Secondary | ICD-10-CM

## 2023-05-04 LAB — BASIC METABOLIC PANEL
BUN/Creatinine Ratio: 22 — ABNORMAL HIGH (ref 9–20)
BUN: 16 mg/dL (ref 6–20)
CO2: 20 mmol/L (ref 20–29)
Calcium: 9.8 mg/dL (ref 8.7–10.2)
Chloride: 104 mmol/L (ref 96–106)
Creatinine, Ser: 0.74 mg/dL — ABNORMAL LOW (ref 0.76–1.27)
Glucose: 111 mg/dL — ABNORMAL HIGH (ref 70–99)
Potassium: 3.8 mmol/L (ref 3.5–5.2)
Sodium: 141 mmol/L (ref 134–144)
eGFR: 126 mL/min/{1.73_m2} (ref >59)

## 2023-05-11 NOTE — Telephone Encounter (Signed)
Prior Authorization for ITAMAR sent to [employer] via web portal. Tracking Number  [id number] PA REQ-  

## 2023-05-15 ENCOUNTER — Encounter (INDEPENDENT_AMBULATORY_CARE_PROVIDER_SITE_OTHER): Payer: 59 | Admitting: Cardiology

## 2023-05-15 DIAGNOSIS — G4733 Obstructive sleep apnea (adult) (pediatric): Secondary | ICD-10-CM

## 2023-05-15 NOTE — Telephone Encounter (Signed)
Pt has been given PIN# [id number]. Pt will do sleep study one night this week.   Called and made the patient aware that he  may proceed with the [facility] Sleep Study. PIN # provided to the patient. Patient made aware that he will be contacted after the test has been read with the results and any recommendations. Patient verbalized understanding and thanked me for the call.

## 2023-05-16 ENCOUNTER — Ambulatory Visit: Payer: 59 | Attending: Nurse Practitioner

## 2023-05-16 DIAGNOSIS — I10 Essential (primary) hypertension: Secondary | ICD-10-CM

## 2023-05-16 DIAGNOSIS — R079 Chest pain, unspecified: Secondary | ICD-10-CM

## 2023-05-16 DIAGNOSIS — I420 Dilated cardiomyopathy: Secondary | ICD-10-CM

## 2023-05-16 DIAGNOSIS — E1169 Type 2 diabetes mellitus with other specified complication: Secondary | ICD-10-CM

## 2023-05-16 DIAGNOSIS — G4730 Sleep apnea, unspecified: Secondary | ICD-10-CM

## 2023-05-16 DIAGNOSIS — R0683 Snoring: Secondary | ICD-10-CM

## 2023-05-16 NOTE — Procedures (Signed)
SLEEP STUDY REPORT Patient Information Study Date: 05/16/2023 Patient Name: Dalton Hobbs Patient ID: [id number] Birth Date: Jun 08, 1996 Age: 27 Gender: Male BMI: 40.4 (W=282 lb, H=5' 10'') Stopbang: 6 Referring Physician: [name], NP  TEST DESCRIPTION: Home sleep apnea testing was completed using the WatchPat, a Type 1 device, utilizing peripheral arterial tonometry (PAT), chest movement, actigraphy, pulse oximetry, pulse rate, body position and snore. AHI was calculated with apnea and hypopnea using valid sleep time as the denominator. RDI includes apneas, hypopneas, and RERAs. The data acquired and the scoring of sleep and all associated events were performed in accordance with the recommended standards and specifications as outlined in the AASM Manual for the Scoring of Sleep and Associated Events 2.2.0 (2015).   FINDINGS:   1. Mild Obstructive Sleep Apnea with AHI 7.7/hr overall but moderate during REM sleep with REM AHi 17.6/hr.   2. No Central Sleep Apnea with pAHIc 0.2/hr.   3. Oxygen desaturations as low as 87%.   4. Moderate snoring was present. O2 sats were < 88% for 1.3 min.   5. Total sleep time was 5 hrs and 43 min.   6. 24.2% of total sleep time was spent in REM sleep.   7.  sleep onset latency at 25 min.   8.  REM sleep onset latency at 84 min.   9. Total awakenings were 7.  10. Arrhythmia detection:  None  DIAGNOSIS: Mild Obstructive Sleep Apnea (G47.33)  RECOMMENDATIONS:   1.  Clinical correlation of these findings is necessary.  The decision to treat obstructive sleep apnea (OSA) is usually based on the presence of apnea symptoms or the presence of associated medical conditions such as Hypertension, Congestive Heart Failure, Atrial Fibrillation or Obesity.  The most common symptoms of OSA are snoring, gasping for breath while sleeping, daytime sleepiness and fatigue.   2.  Initiating apnea therapy is recommended given the presence of symptoms and/or  associated conditions. Recommend proceeding with one of the following:     a.  Auto-CPAP therapy with a pressure range of 5-20cm H2O.     b.  An oral appliance (OA) that can be obtained from certain dentists with expertise in sleep medicine.  These are primarily of use in non-obese patients with mild and moderate disease.     c.  An ENT consultation which may be useful to look for specific causes of obstruction and possible treatment options.     d.  If patient is intolerant to PAP therapy, consider referral to ENT for evaluation for hypoglossal nerve stimulator.   3.  Close follow-up is necessary to ensure success with CPAP or oral appliance therapy for maximum benefit.  4.  A follow-up oximetry study on CPAP is recommended to assess the adequacy of therapy and determine the need for supplemental oxygen or the potential need for Bi-level therapy.  An arterial blood gas to determine the adequacy of baseline ventilation and oxygenation should also be considered.  5.  Healthy sleep recommendations include:  adequate nightly sleep (normal 7-9 hrs/night), avoidance of caffeine after noon and alcohol near bedtime, and maintaining a sleep environment that is cool, dark and quiet.  6.  Weight loss for overweight patients is recommended.  Even modest amounts of weight loss can significantly improve the severity of sleep apnea.  7.  Snoring recommendations include:  weight loss where appropriate, side sleeping, and avoidance of alcohol before bed.  8.  Operation of motor vehicle should be avoided when sleepy.  Signature: Keslie Gritz  Mayford Knife, MD; [facility]; Diplomat, American Board of Sleep Medicine Electronically Signed: 05/16/2023 10:58:59 AM

## 2023-05-25 ENCOUNTER — Other Ambulatory Visit: Payer: Self-pay | Admitting: *Deleted

## 2023-05-25 DIAGNOSIS — L819 Disorder of pigmentation, unspecified: Secondary | ICD-10-CM

## 2023-06-05 ENCOUNTER — Ambulatory Visit: Payer: 59 | Admitting: Physician Assistant

## 2023-06-05 ENCOUNTER — Ambulatory Visit (HOSPITAL_COMMUNITY)
Admission: RE | Admit: 2023-06-05 | Discharge: 2023-06-05 | Disposition: A | Discharge status: Home / Self Care | Payer: 59 | Source: Ambulatory Visit | Attending: Vascular Surgery | Admitting: Vascular Surgery

## 2023-06-05 VITALS — BP 128/81 | HR 93 | Temp 98.3°F | Ht 70.0 in | Wt 282.6 lb

## 2023-06-05 DIAGNOSIS — L819 Disorder of pigmentation, unspecified: Secondary | ICD-10-CM | POA: Diagnosis present

## 2023-06-05 DIAGNOSIS — I872 Venous insufficiency (chronic) (peripheral): Secondary | ICD-10-CM

## 2023-06-05 DIAGNOSIS — M7989 Other specified soft tissue disorders: Secondary | ICD-10-CM

## 2023-06-05 NOTE — Progress Notes (Signed)
Requested by:  Avanell Shackleton, NP-C [address] [place],  [place] [address]  Reason for consultation: pain and edema with discoloration of left lower leg    History of Present Illness   Dalton Hobbs is a 27 y.o. (1996/06/04) male who presents for evaluation of pain and edema with discoloration of left lower leg. He says this started earlier this year and he was recently Hospitalized in August and had brought up his leg discomfort. No DVT was found at that time. He was advised to follow up with PCP at discharge. When he brought it up to his PCP at follow up she recommended follow up with vascular. Thus why he is here today. He explains that the swelling and discomfort in his leg has been primarily unchanged since it began. It bothers him more on prolonged sitting. He says he gets up and walks around and generally this helps. He has also tried elevation, which is somewhat helpful also. He also reports some darkening of the skin on back of his left leg. He also has some intermittent tingling feelings. He explains however that he was over past year diagnosed with Type II DM. He since has been trying to make some lifestyle changes such as dietary changes as well as incorporate exercise. He feels that with high carb foods the tingling in his legs are worse. He says prior to his diagnosis he was very sedentary. 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 He has never worn compression before. He has no history of DVT. No family history of DVT or venous disease.   Venous symptoms include: aching, throbbing, swelling, tingling, darkening of skin Onset/duration:  > 1 year  Occupation:  IT Aggravating factors: prolonged sitting, standing Alleviating factors: elevation, ambulation Compression:  no Helps:  n/a Pain medications:  no Previous vein procedures:  no History of DVT:  no  Past Medical History:  Diagnosis Date   Headache    New onset type 2 diabetes mellitus (HCC)  01/14/2022    Past Surgical History:  Procedure Laterality Date   APPENDECTOMY  06/09/2016   heart defect surgery     as an infant    LAPAROSCOPIC APPENDECTOMY N/A 06/09/2016   Procedure: APPENDECTOMY LAPAROSCOPIC;  Surgeon: Abigail Miyamoto, MD;  Location: [facility] OR;  Service: General;  Laterality: N/A;    Social History   Socioeconomic History   Marital status: Single    Spouse name: Not on file   Number of children: Not on file   Years of education: Not on file   Highest education level: Not on file  Occupational History   Not on file  Tobacco Use   Smoking status: Never   Smokeless tobacco: Never  Substance and Sexual Activity   Alcohol use: No   Drug use: No   Sexual activity: Not on file  Other Topics Concern   Not on file  Social History Narrative   Not on file   Social Determinants of Health   Financial Resource Strain: Not on file  Food Insecurity: No Food Insecurity (04/17/2023)   Hunger Vital Sign    Worried About Running Out of Food in the Last Year: Never true    Ran Out of Food in the Last Year: Never true  Transportation Needs: No Transportation Needs (04/17/2023)   PRAPARE - Administrator, Civil Service (Medical): No    Lack of Transportation (Non-Medical): No  Physical Activity:  Not on file  Stress: Not on file  Social Connections: Not on file  Intimate Partner Violence: Not At Risk (04/17/2023)   Humiliation, Afraid, Rape, and Kick questionnaire    Fear of Current or Ex-Partner: No    Emotionally Abused: No    Physically Abused: No    Sexually Abused: No    Family History  Problem Relation Age of Onset   Diabetes Father     Current Outpatient Medications  Medication Sig Dispense Refill   metFORMIN (GLUCOPHAGE) 500 MG tablet TAKE 1 TABLET BY MOUTH TWICE  DAILY WITH MEALS 180 tablet 1   losartan (COZAAR) 25 MG tablet Take 1 tablet (25 mg total) by mouth daily. 30 tablet 0   No current facility-administered medications for this  visit.    No Known Allergies  REVIEW OF SYSTEMS (negative unless checked):   Cardiac:  []  Chest pain or chest pressure? []  Shortness of breath upon activity? []  Shortness of breath when lying flat? []  Irregular heart rhythm?  Vascular:  []  Pain in calf, thigh, or hip brought on by walking? []  Pain in feet at night that wakes you up from your sleep? []  Blood clot in your veins? [x]  Leg swelling?  Pulmonary:  []  Oxygen at home? []  Productive cough? []  Wheezing?  Neurologic:  []  Sudden weakness in arms or legs? []  Sudden numbness in arms or legs? []  Sudden onset of difficult speaking or slurred speech? []  Temporary loss of vision in one eye? []  Problems with dizziness?  Gastrointestinal:  []  Blood in stool? []  Vomited blood?  Genitourinary:  []  Burning when urinating? []  Blood in urine?  Psychiatric:  []  Major depression  Hematologic:  []  Bleeding problems? []  Problems with blood clotting?  Dermatologic:  []  Rashes or ulcers?  Constitutional:  []  Fever or chills?  Ear/Nose/Throat:  []  Change in hearing? []  Nose bleeds? []  Sore throat?  Musculoskeletal:  []  Back pain? []  Joint pain? []  Muscle pain?   Physical Examination     Vitals:   06/05/23 1317  BP: 128/81  Pulse: 93  Temp: 98.3 F (36.8 C)  TempSrc: Temporal  Weight: 282 lb 9.6 oz (128.2 kg)  Height: 5\' 10"  (1.778 m)   Body mass index is 40.55 kg/m.  General:  WDWN in NAD; vital signs documented above Gait: Normal HENT: WNL, normocephalic Pulmonary: normal non-labored breathing , without wheezing Cardiac: regular HR Abdomen: soft Vascular Exam/Pulses:2+ Dp and PT pulses Extremities: without varicose veins, without reticular veins, with edema, with stasis pigmentation of distal posterior- medial left leg, without lipodermatosclerosis, without ulcers Musculoskeletal: no muscle wasting or atrophy  Neurologic: A&O X 3;  No focal weakness or paresthesias are detected Psychiatric:   The pt has Normal affect.  Non-invasive Vascular Imaging   BLE Venous Insufficiency Duplex (06/05/23):  LLE: No DVT and SVT GSV reflux SFJ GSV diameter 0.499-.59 cm No SSV reflux CFV, FV, Popliteal deep venous reflux   Medical Decision Making   Dalton Hobbs is a 27 y.o. male who presents with: LLE chronic venous insufficiency with pain, swelling, and skin changes. Duplex shows no DVT or SVT. 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 Minimal GSV reflux. No SSV reflux. While his GSV is of adequate size to be considered for venous ablation I discussed with patient that Im not sure the ablation would provide him with much symptom benefit as most of his reflux is in the deep venous symptoms. At this time I would recommend conservative therapy and if he  has no improvement I explained that we could re evaluate him at that time Based on the patient's history and examination, I recommend: daily elevation of 20-30 minutes above level of heart, daily compression stocking use, exercise, weight reduction, refraining from prolonged sitting or standing.. I discussed with the patient the use of her 20-30 mm knee high compression stockings. He was measured and purchased a pair at today's visit He can follow up with [place] as needed if he has any new or worsening symptoms    Graceann Congress, PA-C [facility] Office: [phone]  06/05/2023, 1:24 PM  Clinic MD:  Myra Gianotti

## 2023-06-06 ENCOUNTER — Other Ambulatory Visit (HOSPITAL_COMMUNITY): Payer: Self-pay

## 2023-06-06 ENCOUNTER — Other Ambulatory Visit: Payer: Self-pay

## 2023-06-06 ENCOUNTER — Telehealth: Payer: Self-pay

## 2023-06-06 DIAGNOSIS — G4733 Obstructive sleep apnea (adult) (pediatric): Secondary | ICD-10-CM

## 2023-06-06 MED ORDER — LOSARTAN POTASSIUM 25 MG PO TABS
25.0000 mg | ORAL_TABLET | Freq: Every day | ORAL | 0 refills | Status: AC
  Filled 2023-06-06: qty 30, 30d supply, fill #0

## 2023-06-06 NOTE — Telephone Encounter (Signed)
-----   Message from Armanda Magic sent at 05/16/2023 11:00 AM EDT ----- Please let patient know that they have sleep apnea.  Recommend therapeutic CPAP titration for treatment of patient's sleep disordered breathing.  If unable to perform an in lab titration then initiate ResMed auto CPAP from 4 to 15cm H2O with heated humidity and mask of choice and overnight pulse ox on CPAP.

## 2023-06-07 ENCOUNTER — Other Ambulatory Visit (HOSPITAL_COMMUNITY): Payer: Self-pay

## 2023-06-07 ENCOUNTER — Encounter (HOSPITAL_COMMUNITY): Payer: Self-pay

## 2023-06-14 ENCOUNTER — Telehealth: Payer: Self-pay

## 2023-06-14 NOTE — Telephone Encounter (Signed)
**Note De-Identified Carrie Schoonmaker Obfuscation** Auth #: [id number] Pending [employer] review.

## 2023-06-26 NOTE — Telephone Encounter (Signed)
**Note De-Identified Chandrea Zellman Obfuscation** No response from [facility] Portal on last CPAP Titration done on 10/9.  Resubmitted Alisa Stjames the [facility] Provider Portal-PENDED Reason: 1.Disposition pending review Tracking #: [id number]

## 2023-06-27 NOTE — Telephone Encounter (Signed)
**Note De-Identified Dalton Hobbs Obfuscation** [employer]-PENDED Reason: 1.Disposition pending review Tracking #: [id number]

## 2023-06-28 NOTE — Telephone Encounter (Signed)
**Note De-Identified Rocklin Soderquist Obfuscation** Called received from [place] with [facility] advising me that they canceled the case for Tracking #: [id number] because I chose outpatient hospital procedure and should have chosen just outpatient procedure.  New Tracking # for this case is [id number] and Myrene Buddy verified that they did receive the clinicals that I included with the prior PA that was canceled and have forwarded all to clinical review.  Myrene Buddy states that we should be receiving a fax with their determination within 72 hours at fax number [phone].

## 2023-07-14 NOTE — Telephone Encounter (Signed)
**Note De-Identified Jospeh Mangel Obfuscation** No response concerning the last CPAP Titration so I stated a new PA: Status: PENDED  Reason: 1.Disposition pending review Tracking #: [id number]

## 2023-09-20 ENCOUNTER — Other Ambulatory Visit: Payer: Self-pay | Admitting: Family Medicine

## 2023-09-20 DIAGNOSIS — E119 Type 2 diabetes mellitus without complications: Secondary | ICD-10-CM

## 2023-09-27 ENCOUNTER — Other Ambulatory Visit: Payer: Self-pay | Admitting: Family Medicine

## 2023-09-27 DIAGNOSIS — E119 Type 2 diabetes mellitus without complications: Secondary | ICD-10-CM

## 2023-10-25 ENCOUNTER — Other Ambulatory Visit: Payer: Self-pay | Admitting: Family Medicine

## 2023-10-25 DIAGNOSIS — E119 Type 2 diabetes mellitus without complications: Secondary | ICD-10-CM

## 2024-01-25 ENCOUNTER — Other Ambulatory Visit: Payer: Self-pay | Admitting: Family Medicine

## 2024-01-25 DIAGNOSIS — E119 Type 2 diabetes mellitus without complications: Secondary | ICD-10-CM

## 2024-02-24 ENCOUNTER — Encounter: Payer: Self-pay | Admitting: Family Medicine

## 2024-02-25 ENCOUNTER — Emergency Department (HOSPITAL_BASED_OUTPATIENT_CLINIC_OR_DEPARTMENT_OTHER)
Admission: EM | Admit: 2024-02-25 | Discharge: 2024-02-25 | Disposition: A | Discharge status: Home / Self Care | Attending: Emergency Medicine | Admitting: Emergency Medicine

## 2024-02-25 ENCOUNTER — Encounter (HOSPITAL_BASED_OUTPATIENT_CLINIC_OR_DEPARTMENT_OTHER): Payer: Self-pay

## 2024-02-25 ENCOUNTER — Other Ambulatory Visit: Payer: Self-pay

## 2024-02-25 DIAGNOSIS — E119 Type 2 diabetes mellitus without complications: Secondary | ICD-10-CM | POA: Insufficient documentation

## 2024-02-25 DIAGNOSIS — Z7984 Long term (current) use of oral hypoglycemic drugs: Secondary | ICD-10-CM | POA: Diagnosis not present

## 2024-02-25 DIAGNOSIS — H9202 Otalgia, left ear: Secondary | ICD-10-CM | POA: Diagnosis present

## 2024-02-25 DIAGNOSIS — H60332 Swimmer's ear, left ear: Secondary | ICD-10-CM | POA: Diagnosis not present

## 2024-02-25 MED ORDER — IBUPROFEN 400 MG PO TABS
600.0000 mg | ORAL_TABLET | Freq: Once | ORAL | Status: AC
  Administered 2024-02-25: 600 mg via ORAL
  Filled 2024-02-25: qty 1

## 2024-02-25 MED ORDER — CIPROFLOXACIN-DEXAMETHASONE 0.3-0.1 % OT SUSP
4.0000 [drp] | Freq: Once | OTIC | Status: AC
  Administered 2024-02-25: 4 [drp] via OTIC
  Filled 2024-02-25: qty 7.5

## 2024-02-25 MED ORDER — ACETAMINOPHEN 325 MG PO TABS
650.0000 mg | ORAL_TABLET | Freq: Once | ORAL | Status: AC
  Administered 2024-02-25: 650 mg via ORAL
  Filled 2024-02-25: qty 2

## 2024-02-25 MED ORDER — CIPROFLOXACIN-DEXAMETHASONE 0.3-0.1 % OT SUSP: 4.0000 [drp] | Freq: Two times a day (BID) | OTIC | 0 refills | Status: AC

## 2024-02-25 NOTE — ED Provider Notes (Signed)
  [place] EMERGENCY DEPARTMENT AT [facility] Provider Note   CSN: [id number] Arrival date & time: 02/25/24  0006     Patient presents with: Dalton Hobbs is a 28 y.o. male.   The history is provided by the patient.  Otalgia He has history of diabetes and comes in complaining of left ear pain over the last 2 days.  Pain has been getting progressively worse.  He has noted some slight drainage from the ear.  Hearing has been decreased.  He denies fever or chills.   Prior to Admission medications   Medication Sig Start Date End Date Taking? Authorizing Provider  losartan  (COZAAR ) 25 MG tablet Take 1 tablet (25 mg total) by mouth daily. 06/06/23 07/07/23  Wyn Jackee VEAR Mickey., NP  metFORMIN  (GLUCOPHAGE ) 500 MG tablet TAKE 1 TABLET BY MOUTH TWICE DAILY WITH MEALS. Needs office visit for further refills. 10/26/23   Lendia Boby CROME, NP-C    Allergies: Patient has no known allergies.    Review of Systems  HENT:  Positive for ear pain.   All other systems reviewed and are negative.   Updated Vital Signs BP (!) 147/96   Pulse 100   Temp 98.5 F (36.9 C) (Oral) Comment: Simultaneous filing. User may not have seen previous data.  Resp 18   SpO2 93%   Physical Exam Vitals and nursing note reviewed.   28 year old male, resting comfortably and in no acute distress. Vital signs are significant for elevated blood pressure. Oxygen saturation is 93%, which is normal. Head is normocephalic and atraumatic. PERRLA, EOMI. right tympanic membrane is clear.  Pain is elicited when there is tension applied to the helix of the left ear.  There is moderate edema of the left external auditory canal with some watery drainage in the canal.  Left tympanic membrane is normal. Neck is nontender and supple without adenopathy . Lungs are clear without rales, wheezes, or rhonchi. Heart has regular rate and rhythm without murmur. Neurologic: Awake and alert, moves all extremities  equally.   Procedures   Medications Ordered in the ED  ibuprofen (ADVIL) tablet 600 mg (has no administration in time range)  acetaminophen  (TYLENOL ) tablet 650 mg (has no administration in time range)  ciprofloxacin-dexamethasone (CIPRODEX) 0.3-0.1 % OTIC (EAR) suspension 4 drop (has no administration in time range)                                    Medical Decision Making  Left otitis externa.  I am discharging him with a prescription for ciprofloxacin-dexamethasone suspension, advised to use over-the-counter NSAIDs and acetaminophen  as needed for pain.  Follow-up with ENT if not improving.     Final diagnoses:  Acute swimmer's ear of left side    ED Discharge Orders          Ordered    ciprofloxacin-dexamethasone (CIPRODEX) OTIC suspension  2 times daily        02/25/24 0057               Raford Lenis, MD 02/25/24 [phone]

## 2024-02-25 NOTE — Discharge Instructions (Addendum)
 You may take ibuprofen or naproxen as needed for pain.  If you need additional pain relief, you may take acetaminophen .  Please be aware that when you combine acetaminophen  with naproxen or acetaminophen  with ibuprofen, you get better pain relief and you get from taking either medication by itself.  If your ear is not getting better over the next several days, then follow-up with the ENT physician.
